# Patient Record
Sex: Male | Born: 1940 | Race: White | Hispanic: No | Marital: Married | Smoking: Former smoker
Health system: Southern US, Community
[De-identification: ages and names within clinical notes are randomized; demographics above are authoritative.]

## PROBLEM LIST (undated history)

## (undated) DIAGNOSIS — I10 Essential (primary) hypertension: Secondary | ICD-10-CM

## (undated) DIAGNOSIS — J439 Emphysema, unspecified: Secondary | ICD-10-CM

## (undated) DIAGNOSIS — E785 Hyperlipidemia, unspecified: Secondary | ICD-10-CM

## (undated) HISTORY — PX: KYPHOSIS SURGERY: SHX114

## (undated) HISTORY — DX: Hyperlipidemia, unspecified: E78.5

## (undated) HISTORY — PX: TONSILLECTOMY: SUR1361

## (undated) HISTORY — PX: FIXATION KYPHOPLASTY LUMBAR SPINE: SHX1642

## (undated) HISTORY — PX: CORONARY ARTERY BYPASS GRAFT: SHX141

## (undated) HISTORY — DX: Essential (primary) hypertension: I10

## (undated) HISTORY — PX: SHOULDER ARTHROSCOPY: SHX128

## (undated) HISTORY — DX: Emphysema, unspecified: J43.9

---

## 1999-08-13 ENCOUNTER — Encounter: Admission: RE | Admit: 1999-08-13 | Discharge: 1999-08-13 | Payer: Self-pay | Admitting: Family Medicine

## 1999-08-13 ENCOUNTER — Encounter: Payer: Self-pay | Admitting: Family Medicine

## 1999-10-29 ENCOUNTER — Encounter: Admission: RE | Admit: 1999-10-29 | Discharge: 1999-10-29 | Payer: Self-pay | Admitting: Family Medicine

## 1999-10-29 ENCOUNTER — Encounter: Payer: Self-pay | Admitting: Family Medicine

## 2000-07-29 ENCOUNTER — Encounter: Payer: Self-pay | Admitting: Family Medicine

## 2000-07-29 ENCOUNTER — Encounter: Admission: RE | Admit: 2000-07-29 | Discharge: 2000-07-29 | Payer: Self-pay | Admitting: Family Medicine

## 2003-09-08 ENCOUNTER — Encounter: Admission: RE | Admit: 2003-09-08 | Discharge: 2003-09-08 | Payer: Self-pay | Admitting: Orthopedic Surgery

## 2008-03-03 ENCOUNTER — Encounter: Admission: RE | Admit: 2008-03-03 | Discharge: 2008-03-03 | Payer: Self-pay | Admitting: Orthopedic Surgery

## 2008-03-04 ENCOUNTER — Encounter: Admission: RE | Admit: 2008-03-04 | Discharge: 2008-03-04 | Payer: Self-pay | Admitting: Orthopedic Surgery

## 2008-03-24 ENCOUNTER — Encounter: Admission: RE | Admit: 2008-03-24 | Discharge: 2008-03-24 | Payer: Self-pay | Admitting: Orthopedic Surgery

## 2009-04-27 ENCOUNTER — Ambulatory Visit: Payer: Self-pay | Admitting: Internal Medicine

## 2009-04-27 DIAGNOSIS — J441 Chronic obstructive pulmonary disease with (acute) exacerbation: Secondary | ICD-10-CM

## 2009-05-04 ENCOUNTER — Ambulatory Visit: Payer: Self-pay | Admitting: Internal Medicine

## 2009-05-04 DIAGNOSIS — J9611 Chronic respiratory failure with hypoxia: Secondary | ICD-10-CM | POA: Insufficient documentation

## 2009-05-04 DIAGNOSIS — J45909 Unspecified asthma, uncomplicated: Secondary | ICD-10-CM | POA: Insufficient documentation

## 2009-05-04 DIAGNOSIS — J438 Other emphysema: Secondary | ICD-10-CM

## 2009-05-04 DIAGNOSIS — E785 Hyperlipidemia, unspecified: Secondary | ICD-10-CM | POA: Insufficient documentation

## 2009-05-04 DIAGNOSIS — I10 Essential (primary) hypertension: Secondary | ICD-10-CM | POA: Insufficient documentation

## 2009-06-01 ENCOUNTER — Ambulatory Visit: Payer: Self-pay | Admitting: Internal Medicine

## 2009-07-27 ENCOUNTER — Telehealth: Payer: Self-pay | Admitting: Internal Medicine

## 2009-09-11 ENCOUNTER — Ambulatory Visit: Payer: Self-pay | Admitting: Internal Medicine

## 2010-03-13 ENCOUNTER — Telehealth: Payer: Self-pay | Admitting: Internal Medicine

## 2010-08-06 ENCOUNTER — Encounter: Payer: Self-pay | Admitting: Orthopedic Surgery

## 2010-08-14 NOTE — Progress Notes (Signed)
Summary: nos appt  Phone Note Call from Patient   Caller: [email or web address]  Call For: Madilyne Tadlock Summary of Call: In ref to nos from 8/29, pt states he will call to rsc. Initial call taken by: Darletta Moll,  March 13, 2010 10:55 AM

## 2010-08-14 NOTE — Progress Notes (Signed)
Summary: rx-LMTCB x2 OK resume Lisinopril  Phone Note Call from Patient   Caller: Patient Call For: Khiya Friese Summary of Call: Benicar doesn't work as well as Lisinopril.  Would like to go back to Lisinopril. [phone] - [facility] Initial call taken by: Eugene Gavia,  July 27, 2009 12:03 PM  Follow-up for Phone Call        LMOM TCB. Boone Master CNA  July 27, 2009 12:24 PM   LMTCB x 2 .Carron Curie CMA  July 28, 2009 3:43 PM   Additional Follow-up for Phone Call Additional follow up Details #1::        Per last ov note on 06/01/09, pt was to trial samples of Benicar HCT 20/12.5 x15mo in place of lisinopril to see if cough improved.  Spoke with pt.  Pt states cough and breathing are the same-no better or worse.  Would like to know if he can resume Lisinopril?  WIll forward message to CY-please advise.  Thanks! Additional Follow-up by: Gweneth Dimitri RN,  July 28, 2009 3:51 PM    Additional Follow-up for Phone Call Additional follow up Details #2::    OK to resume Lisinopril Follow-up by: Waymon Budge MD,  July 28, 2009 5:17 PM  Additional Follow-up for Phone Call Additional follow up Details #3:: Details for Additional Follow-up Action Taken: pt advised. rx sent. Carron Curie CMA  July 28, 2009 5:19 PM   Prescriptions: LISINOPRIL-HYDROCHLOROTHIAZIDE 20-25 MG TABS (LISINOPRIL-HYDROCHLOROTHIAZIDE) take 1 by mouth once daily  #30 x 3   Entered by:   Carron Curie CMA   Authorized by:   Waymon Budge MD   Signed by:   Carron Curie CMA on 07/28/2009   Method used:   Electronically to        [facility]  [id number] (retail)       [address]       [place], [place]  [address]       Ph: [phone]       Fax: [phone]   RxID:   [id number]

## 2010-08-14 NOTE — Assessment & Plan Note (Signed)
Summary: 81M RECK/KLW   Primary Provider/Referring Provider:  Runell Gess  CC:  3 month followup.  Pt states that cough is the same- no better or worse.  No new complaints today.[name]  History of Present Illness: April 27, 2009- 68 [name] referred by Dr Janece Canterbury in pulmonary evaluation because of easy exertional dyspnea. He had been a heavy smoker. No childhood problems. Short of breath walking to mailbox.Daily cough productive of white mucus. Wife emphasizes they are moving away from a moldy home, but specific plans aren't yet made. He feels comfortable resting and supine. Somewhat worse in pollen season.Some wheeze. He has been told in the past he had COPD. Recently Spiriva has helped more than his rescue inhaler. Seasonal rhinitis in past years. No pneumovax. avoids flu vax "makes him sick".  June 01, 2009- COPD, Asthma........................................Marland Kitchenwife here They ask again if mold is bad, saying they are going to walk away from their home because of mold. Discussed dehumidifiers. Tends to cough, not changed. Scant clear.  Got Spiriva- helps. PFT- moderate COPD- emphysma, with little response to bronchodilator - Sat of 88% at start, improving to 92 with exertion. Note that at rest today here on room air he is at 92%. CXR- COPD, NAD  2009-09-16- COPD, Asthma Since last here may have had a minor cold, but no Flu. Did have flu vax. Coughs some with scant clear mucus. Sinus drip he treats with otc antihistamine every other day. Denies wheezing.  Needs refill on Spiriva. Dyspnea is unchanged- brisk walk across street or climbing stairs.    Preventive Screening-Counseling & Management  Alcohol-Tobacco     Smoking Status: quit  Current Medications (verified): 1)  Etodolac 400 Mg Tabs (Etodolac) .... Take 2 By Mouth Once Daily 2)  Metoprolol Succinate 25 Mg Xr24h-Tab (Metoprolol Succinate) .... Take 1 By Mouth Once Daily 3)  Simvastatin 80 Mg Tabs  (Simvastatin) .... Take 1 By Mouth Once Daily 4)  Lisinopril-Hydrochlorothiazide 20-25 Mg Tabs (Lisinopril-Hydrochlorothiazide) .... Take 1 By Mouth Once Daily 5)  Isosorbide Dinitrate 30 Mg Tabs (Isosorbide Dinitrate) .... Take 1 By Mouth Once Daily 6)  Cardura 4 Mg Tabs (Doxazosin Mesylate) .... Take 1 By Mouth Once Daily 7)  Aspirin 81 Mg Tbec (Aspirin) .... Take 1 By Mouth Once Daily 8)  Spiriva Handihaler 18 Mcg Caps (Tiotropium Bromide Monohydrate) .[name].. 1 Daily  Allergies (verified): 1)  ! Hydrocodone  Past History:  Past Medical History: Last updated: 04/27/2009 Asthma Emphysema Hyperlipidemia Hypertension  Past Surgical History: Last updated: 04/27/2009 Rt shoulder arthroscopy Tonsillectomy Kyphoplasty- right shoulder Kyphoplasty -  lumbar kyphoplasty CABG  Family History: Last updated: 16-Sep-2009 Father- died MI Mother- died breast cancer Sister- died breast cancer  Social History: Last updated: 09/16/2009 Patient states former smoker. Quit age 89 Married Retired- worked as Production designer, theatre/television/film and superintendant at hotels  Risk Factors: Smoking Status: quit (09-16-09)  Family History: Father- died MI Mother- died breast cancer Sister- died breast cancer  Social History: Patient states former smoker. Quit age 12 Married Retired- worked as Production designer, theatre/television/film and superintendant at hotels  Smoking Status:  quit  Review of Systems      See HPI       The patient complains of dyspnea on exertion and prolonged cough.  The patient denies anorexia, fever, weight loss, weight gain, vision loss, decreased hearing, hoarseness, chest pain, syncope, peripheral edema, headaches, hemoptysis, and severe indigestion/heartburn.    Vital Signs:  Patient profile:   70 year old male Weight:  230.50 pounds O2 Sat:      95 % on Room air Pulse rate:   73 / minute BP sitting:   122 / 68  (left arm)  Vitals Entered By: Vernie Murders (September 11, 2009 2:12 PM)  O2 Flow:   Room air  Physical Exam  Additional Exam:  General: A/Ox3; pleasant and cooperative, NAD, tall,  SKIN: no rash, lesions NODES: no lymphadenopathy HEENT: [place]/AT, EOM- WNL, Conjuctivae- clear, PERRLA, TM-WNL, Nose- clear, Throat- clear and wnl, no stridor NECK: Supple w/ fair ROM, JVD- none, normal carotid impulses w/o bruits Thyroid-  CHEST: Clear to P&A, occasional dry cough HEART: RRR, no m/g/r heard ABDOMEN: Soft and nl;  EGB:TDVV, nl pulses, no edema  NEURO: Grossly intact to observation      Impression & Recommendations:  Problem # 1:  COPD (ICD-496) Clinically stable. Emphasized endurance exercise and reviewed meds. PFT last Fall confirmed moderate emphysema/ COPD with little response to dilator. CXR had shown COPD and post CABG changes.  Other Orders: Est. Patient Level III (61607) Prescription Created Electronically [id number]) Prescription Created Electronically [id number])  Patient Instructions: 1)  Please schedule a follow-up appointment in 6 months. 2)  Script for Spiriva sent to your drug store. Prescriptions: LISINOPRIL-HYDROCHLOROTHIAZIDE 20-25 MG TABS (LISINOPRIL-HYDROCHLOROTHIAZIDE) take 1 by mouth once daily  #30 x prn   Entered and Authorized by:   Waymon Budge MD   Signed by:   Waymon Budge MD on 09/11/2009   Method used:   Electronically to        [facility]  [address] [phone]* (retail)       [address]       [place], [place]  [address]       Ph: [phone]       Fax: [phone]   RxID:   [id number]

## 2010-12-22 IMAGING — CR DG CHEST 2V
2 series · 2 of 2 positions shown · non-contrast
Comparison: Report of a prior study 07/29/2000.  The actual images
have been purged and are not available.

CLINICAL DATA: Dyspnea/asthma

CHEST - 2 VIEW

[view not recorded (1 of 2)]
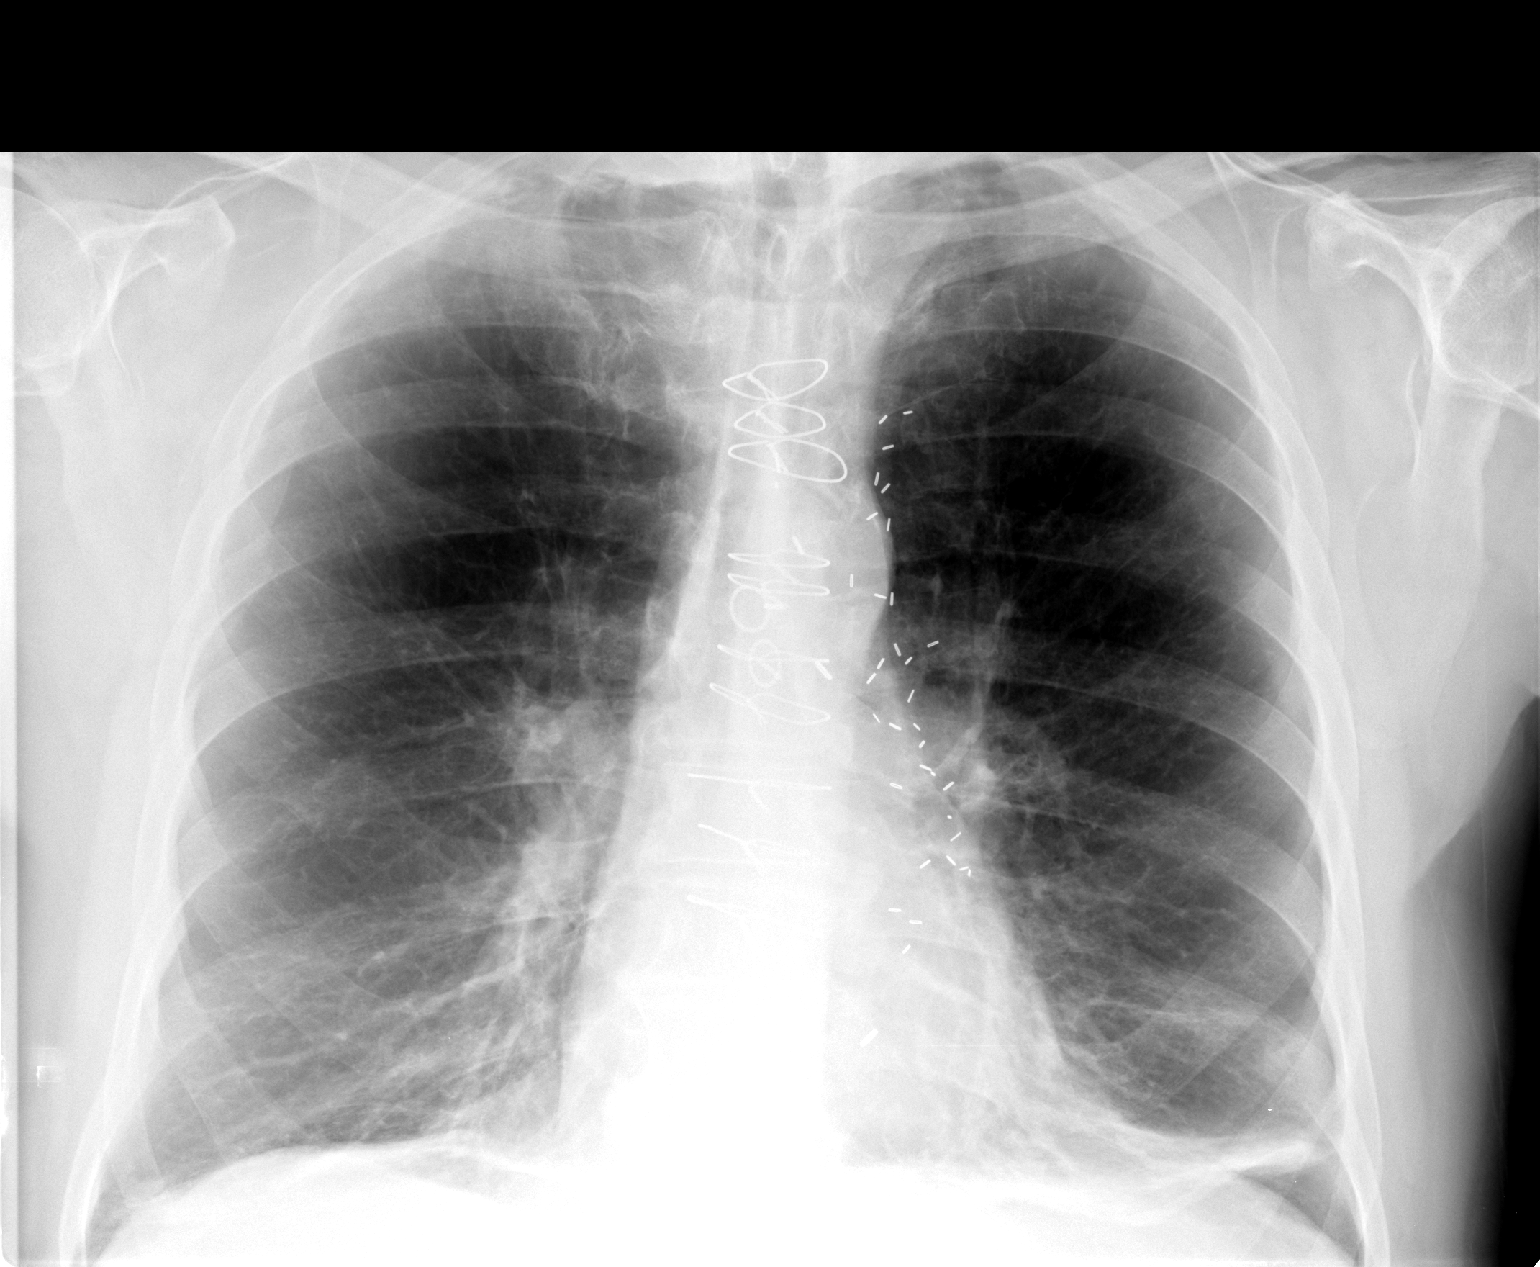

[view not recorded (2 of 2)]
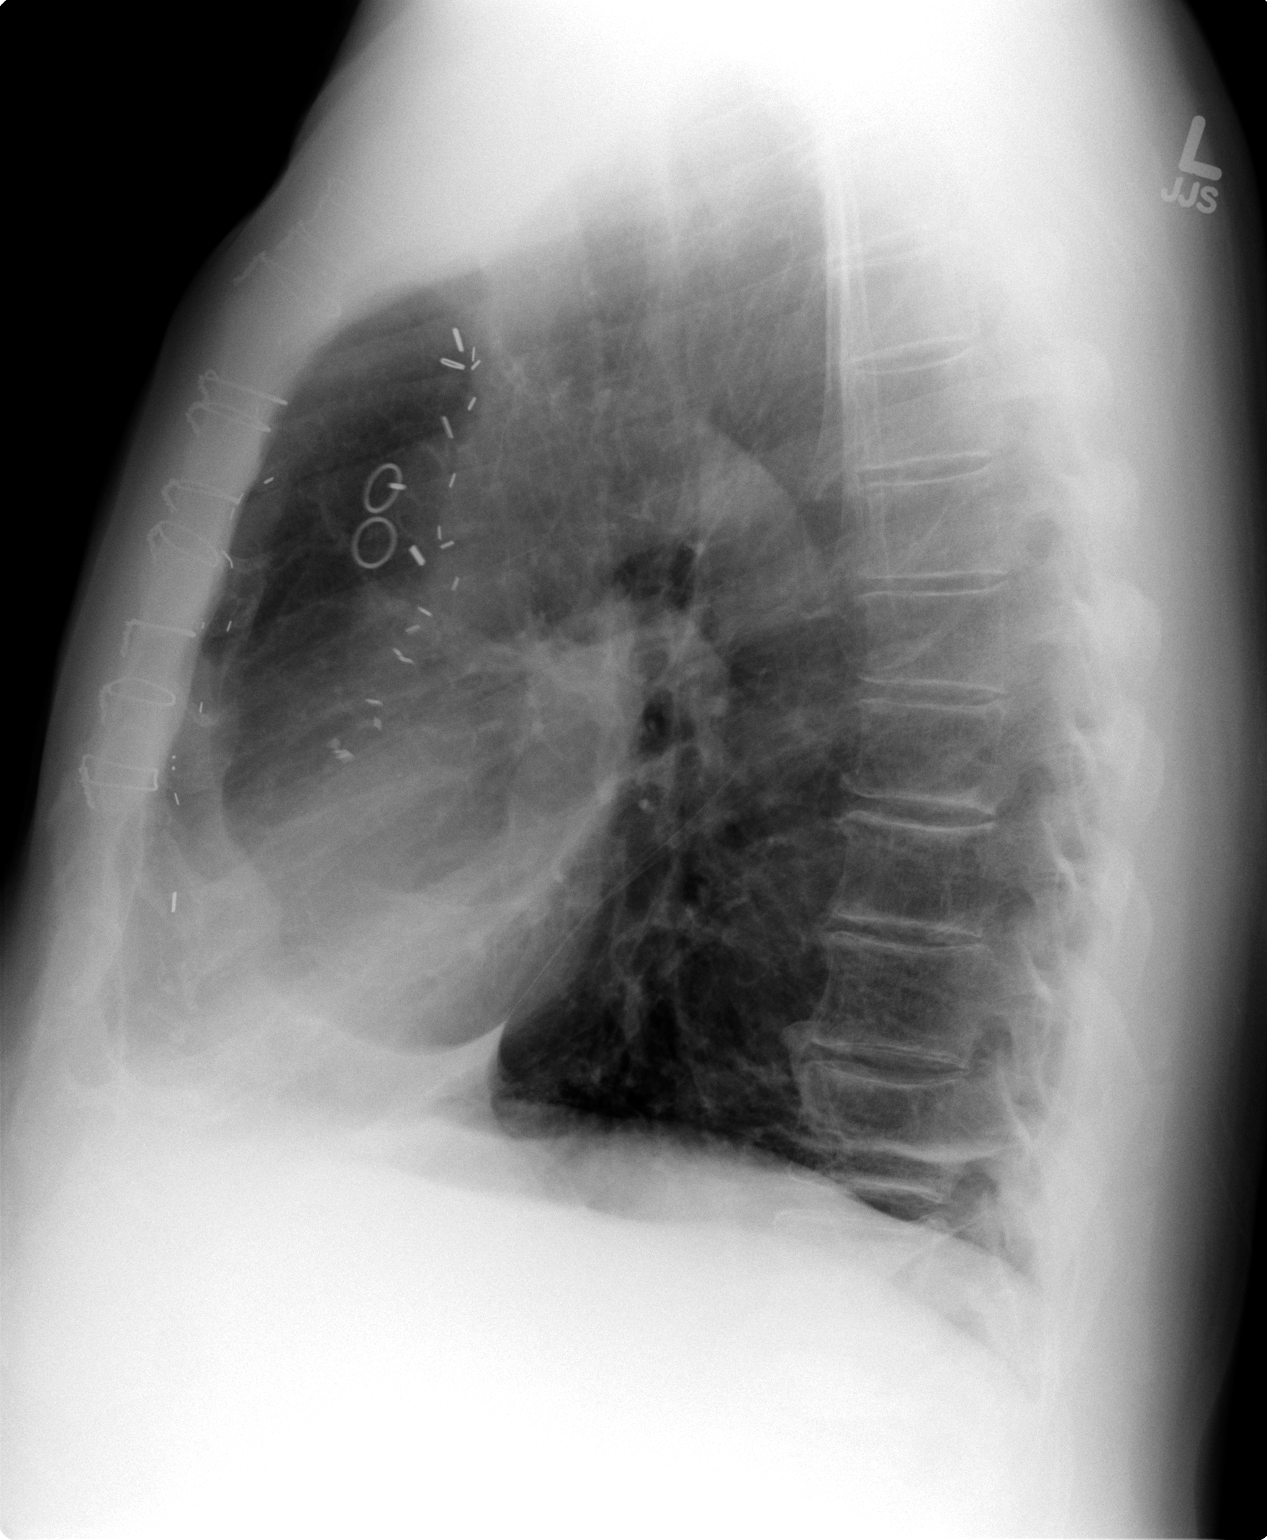

[2 of 2 positions shown; findings below may reference images not displayed]

FINDINGS: Post CABG.  Heart and vascularity normal.  There are
changes of moderately advanced COPD.  There is linear atelectasis
or scarring at the left base.  This was described in the report of
the prior study. Are there any outside chest x-rays that we might
be able to obtain for comparison.

No pleural fluid or osseous lesions.
IMPRESSION: 1.  Post CABG - no heart failure.
2.  COPD with probable linear atelectasis or scarring at the left
base.  This was described in the report of a prior study dated
07/29/2000.  Probably no active disease.  See above discussion.

## 2011-09-05 ENCOUNTER — Encounter: Payer: Self-pay | Admitting: Internal Medicine

## 2011-09-06 ENCOUNTER — Ambulatory Visit (INDEPENDENT_AMBULATORY_CARE_PROVIDER_SITE_OTHER): Payer: Medicare Other | Admitting: Internal Medicine

## 2011-09-06 ENCOUNTER — Ambulatory Visit (INDEPENDENT_AMBULATORY_CARE_PROVIDER_SITE_OTHER)
Admission: RE | Admit: 2011-09-06 | Discharge: 2011-09-06 | Disposition: A | Discharge status: Home / Self Care | Payer: Medicare Other | Source: Ambulatory Visit | Attending: Internal Medicine | Admitting: Internal Medicine

## 2011-09-06 ENCOUNTER — Encounter: Payer: Self-pay | Admitting: Internal Medicine

## 2011-09-06 VITALS — BP 170/70 | HR 95 | Ht 72.0 in | Wt 250.6 lb

## 2011-09-06 DIAGNOSIS — J449 Chronic obstructive pulmonary disease, unspecified: Secondary | ICD-10-CM

## 2011-09-06 MED ORDER — TIOTROPIUM BROMIDE MONOHYDRATE 18 MCG IN CAPS: 18.0000 ug | ORAL_CAPSULE | Freq: Every day | RESPIRATORY_TRACT | Status: DC

## 2011-09-06 NOTE — Progress Notes (Signed)
09/06/11- 63 [name] former smoker followed for asthma, COPD LOV- 09/11/09    Wife here He describes persistent cough, improved with Spiriva but productive of yellow sputum. They got rid of their dog and move to a new home to get away from mold and mildew. Overall he had done much better until this episode. Taking occasional loratadine. Comes today to refill Spiriva. We discussed evaluation of his status. His wife is concerned and asks that we update his PFT. He denies fever, blood, chest pain or palpitation, swelling or nodes. PFT: 05/04/2009-moderate obstructive airways disease with insignificant response to bronchodilator, air trapping, diffusion mildly reduced. FEV1 2.26/73%, FEV1/FVC 0.50, DLCO 71%.  ROS-see HPI Constitutional:   No-   weight loss, night sweats, fevers, chills, fatigue, lassitude. HEENT:   No-  headaches, difficulty swallowing, tooth/dental problems, sore throat,       No-  sneezing, itching, ear ache, nasal congestion, post nasal drip,  CV:  No-   chest pain, orthopnea, PND, swelling in lower extremities, anasarca, dizziness, palpitations Resp: +  shortness of breath with exertion or at rest.             + productive cough,  No non-productive cough,  No- coughing up of blood.              No-   change in color of mucus.  No- wheezing.   Skin: No-   rash or lesions. GI:  No-   heartburn, indigestion, abdominal pain, nausea, vomiting, diarrhea,                 change in bowel habits, loss of appetite GU: . MS:  No-   joint pain or swelling.  No- decreased range of motion.  No- back pain. Neuro-     nothing unusual Psych:  No- change in mood or affect. No depression or anxiety.  No memory loss.  OBJ- Physical Exam General- Alert, Oriented, Affect-appropriate, Distress- none acute, overweight Skin- rash-none, lesions- none, excoriation- none Lymphadenopathy- none Head- atraumatic            Eyes- Gross vision intact, PERRLA, conjunctivae and secretions clear            Ears-  Hearing, canals-normal            Nose- Clear, no-Septal dev, mucus, polyps, erosion, perforation             Throat- Mallampati III , mucosa clear , drainage- none, tonsils- atrophic Neck- flexible , trachea midline, no stridor , thyroid nl, carotid no bruit Chest - symmetrical excursion , unlabored           Heart/CV- RRR , no murmur , no gallop  , no rub, nl s1 s2                           - JVD- none , edema- none, stasis changes- none, varices- none           Lung- clear to P&A, diminished, unlabored, wheeze- none, cough- none , dullness-none, rub- none           Chest wall-  Abd- Br/ Gen/ Rectal- Not done, not indicated Extrem- cyanosis- none, clubbing, none, atrophy- none, strength- nl Neuro- grossly intact to observation

## 2011-09-06 NOTE — Patient Instructions (Signed)
Script to refill Spiriva  Sample Dulera 100   2 puffs then rinse mouth, twice every day  Order- CXR  Dx COPD  Order- schedule PFT  Dx COPD

## 2011-09-09 NOTE — Assessment & Plan Note (Signed)
His immediate request was for refill Spiriva. We discussed alternatives. Plan-refill Spiriva. Sample Dulera. Schedule repeat PFT. Chest x-ray.

## 2011-09-12 NOTE — Progress Notes (Signed)
Quick Note:  Pt aware of results. ______ 

## 2011-09-13 ENCOUNTER — Ambulatory Visit (INDEPENDENT_AMBULATORY_CARE_PROVIDER_SITE_OTHER): Payer: Medicare Other | Admitting: Internal Medicine

## 2011-09-13 DIAGNOSIS — J449 Chronic obstructive pulmonary disease, unspecified: Secondary | ICD-10-CM

## 2011-09-13 LAB — PULMONARY FUNCTION TEST

## 2011-09-13 NOTE — Progress Notes (Signed)
PFT done today. 

## 2011-12-10 ENCOUNTER — Ambulatory Visit: Payer: Medicare Other | Admitting: Internal Medicine

## 2012-01-07 ENCOUNTER — Telehealth: Payer: Self-pay | Admitting: Internal Medicine

## 2012-01-07 NOTE — Telephone Encounter (Signed)
lmomtcb x1 for janice 

## 2012-01-07 NOTE — Telephone Encounter (Signed)
Spoke with pt and notified that pt needs HFU with CDY next wk. Pt d/c'ed from [facility] a wk ago. Please advise thanks!

## 2012-01-08 NOTE — Telephone Encounter (Signed)
Per CY: can see TP for post hosp this week or early next week and follow up with CY in Aug.  LMOM TCB x1.

## 2012-01-09 NOTE — Telephone Encounter (Signed)
Pt has been scheduled for HFU with TP on 01/14/12 @ 4pm per CDY. They will schedule follow-up with CDY when here for that appt.

## 2012-01-14 ENCOUNTER — Ambulatory Visit (INDEPENDENT_AMBULATORY_CARE_PROVIDER_SITE_OTHER)
Admission: RE | Admit: 2012-01-14 | Discharge: 2012-01-14 | Disposition: A | Discharge status: Home / Self Care | Payer: Medicare Other | Source: Ambulatory Visit | Attending: Adult Health | Admitting: Adult Health

## 2012-01-14 ENCOUNTER — Ambulatory Visit (INDEPENDENT_AMBULATORY_CARE_PROVIDER_SITE_OTHER): Payer: Medicare Other | Admitting: Adult Health

## 2012-01-14 ENCOUNTER — Encounter: Payer: Self-pay | Admitting: Adult Health

## 2012-01-14 VITALS — HR 96 | Temp 97.2°F | Ht 72.5 in | Wt 247.4 lb

## 2012-01-14 DIAGNOSIS — J449 Chronic obstructive pulmonary disease, unspecified: Secondary | ICD-10-CM

## 2012-01-14 NOTE — Progress Notes (Signed)
81 [name] former smoker followed for asthma, COPD  LOV- 09/11/09    Wife here He describes persistent cough, improved with Spiriva but productive of yellow sputum. They got rid of their dog and move to a new home to get away from mold and mildew. Overall he had done much better until this episode. Taking occasional loratadine. Comes today to refill Spiriva. We discussed evaluation of his status. His wife is concerned and asks that we update his PFT. He denies fever, blood, chest pain or palpitation, swelling or nodes. PFT: 05/04/2009-moderate obstructive airways disease with insignificant response to bronchodilator, air trapping, diffusion mildly reduced. FEV1 2.26/73%, FEV1/FVC 0.50, DLCO 71%.  01/14/2012 Post Hospital  Discharged 2 weeks ago for PNA at [facility] . No records available.  Finished all the abx and steroid taper. Feeling better, but still has cough. Mainly dry.  Of note on ACE inhibitor.  No fever or chest pain .    ROS- Constitutional:   No  weight loss, night sweats,  Fevers, chills ++, fatigue, or  lassitude.  HEENT:   No headaches,  Difficulty swallowing,  Tooth/dental problems, or  Sore throat,                No sneezing, itching, ear ache,  ++nasal congestion, post nasal drip,   CV:  No chest pain,  Orthopnea, PND, swelling in lower extremities, anasarca, dizziness, palpitations, syncope.   GI  No heartburn, indigestion, abdominal pain, nausea, vomiting, diarrhea, change in bowel habits, loss of appetite, bloody stools.   Resp:    No coughing up of blood.     No chest wall deformity  Skin: no rash or lesions.  GU: no dysuria, change in color of urine, no urgency or frequency.  No flank pain, no hematuria   MS:  No joint pain or swelling.  No decreased range of motion.  No back pain.  Psych:  No change in mood or affect. No depression or anxiety.  No memory loss.   OBJ- Physical Exam GEN: A/Ox3; pleasant , NAD,   HEENT:  [place]/AT,  EACs-clear, TMs-wnl, NOSE-clear,  THROAT-clear, no lesions, no postnasal drip or exudate noted.   NECK:  Supple w/ fair ROM; no JVD; normal carotid impulses w/o bruits; no thyromegaly or nodules palpated; no lymphadenopathy.  RESP  Coarse BS .no accessory muscle use, no dullness to percussion  CARD:  RRR, no m/r/g  , tr  peripheral edema, pulses intact, no cyanosis or clubbing.  GI:   Soft & nt; nml bowel sounds; no organomegaly or masses detected.  Musco: Warm bil, no deformities or joint swelling noted.   Neuro: alert, no focal deficits noted.    Skin: Warm, no lesions or rashes

## 2012-01-14 NOTE — Patient Instructions (Addendum)
I will call with xray results.  Continue on current regimen .  follow up Dr. Maple Hudson  In 6 weeks and As needed   Discuss with family doctor regarding your blood pressure pill -Lisinopril may be causing a persistent/recurrent cough .

## 2012-01-15 NOTE — Assessment & Plan Note (Signed)
REcent exacerbation w/ ? CAP  No records available from [facility]  Will repeat cxr today  ? ACE contributing to his persistent cough   Plan:  I will call with xray results.  Continue on current regimen .  follow up Dr. Maple Hudson  In 6 weeks and As needed   Discuss with family doctor regarding your blood pressure pill -Lisinopril may be causing a persistent/recurrent cough .

## 2012-01-20 ENCOUNTER — Telehealth: Payer: Self-pay | Admitting: Internal Medicine

## 2012-01-20 NOTE — Telephone Encounter (Signed)
Notes Recorded by Julio Sicks, NP on 01/15/2012 at 8:58 AM Xray is ok  PNA is resolved  Cont w/ ov recs   lmomtcb x1

## 2012-01-21 NOTE — Telephone Encounter (Signed)
I spoke with spouse and is aware of results. She voiced her understanding and needed nothing further

## 2012-02-25 ENCOUNTER — Encounter: Payer: Self-pay | Admitting: Internal Medicine

## 2012-02-25 ENCOUNTER — Ambulatory Visit (INDEPENDENT_AMBULATORY_CARE_PROVIDER_SITE_OTHER): Payer: Medicare Other | Admitting: Internal Medicine

## 2012-02-25 VITALS — BP 150/86 | HR 105 | Ht 72.0 in | Wt 246.6 lb

## 2012-02-25 DIAGNOSIS — J449 Chronic obstructive pulmonary disease, unspecified: Secondary | ICD-10-CM

## 2012-02-25 NOTE — Progress Notes (Signed)
71 [name] former smoker followed for asthma, COPD  LOV- 09/11/09    Wife here He describes persistent cough, improved with Spiriva but productive of yellow sputum. They got rid of their dog and moved to a new home to get away from mold and mildew. Overall he had done much better until this episode. Taking occasional loratadine. Comes today to refill Spiriva. We discussed evaluation of his status. His wife is concerned and asks that we update his PFT. He denies fever, blood, chest pain or palpitation, swelling or nodes. PFT: 05/04/2009-moderate obstructive airways disease with insignificant response to bronchodilator, air trapping, diffusion mildly reduced. FEV1 2.26/73%, FEV1/FVC 0.50, DLCO 71%.  01/14/2012 Post Hospital  Discharged 2 weeks ago for PNA at [facility] . No records available.  Finished all the abx and steroid taper. Feeling better, but still has cough. Mainly dry.  Of note on ACE inhibitor.  No fever or chest pain .   02/25/12- [name] former smoker followed for asthma, COPD States about the same; slight wheezing and cough, denies any SOB, chest tightness, or chest pain. Hospitalized for pneumonia at [facility]. Occasional productive cough, never blood. Uses Alka-Seltzer Plus occasionally for chest congestion. Much dry cough-we discussed lisinopril. Spiriva helps. COPD assessment test (CAT) 24/40  ROS-see HPI Constitutional:   No-   weight loss, night sweats, fevers, chills, fatigue, lassitude. HEENT:   No-  headaches, difficulty swallowing, tooth/dental problems, sore throat,       No-  sneezing, itching, ear ache, +nasal congestion, post nasal drip,  CV:  No-   chest pain, orthopnea, PND, swelling in lower extremities, anasarca, dizziness, palpitations Resp: + Some  shortness of breath with exertion or at rest.              No-   productive cough,  + non-productive cough,  No- coughing up of blood.              No-   change in color of mucus.  No- wheezing.   Skin: No-   rash or  lesions. GI:  No-   heartburn, indigestion, abdominal pain, nausea, vomiting,  GU:  MS:  No-   joint pain or swelling.   Neuro-     nothing unusual Psych:  No- change in mood or affect. No depression or anxiety.  No memory loss.  OBJ- Physical Exam General- Alert, Oriented, Affect-appropriate, Distress- none acute Skin- rash-none, lesions- none, excoriation- none Lymphadenopathy- none Head- atraumatic            Eyes- Gross vision intact, PERRLA, conjunctivae and secretions clear            Ears- Hearing, canals-normal            Nose- Clear, no-Septal dev, mucus, polyps, erosion, perforation             Throat- Mallampati II , mucosa clear , drainage- none, tonsils- atrophic Neck- flexible , trachea midline, no stridor , thyroid nl, carotid no bruit Chest - symmetrical excursion , unlabored           Heart/CV- RRR , no murmur , no gallop  , no rub, nl s1 s2                           - JVD- none , edema- none, stasis changes- none, varices- none           Lung- + few rhonchi right upper lobe, wheeze- none, cough- none ,  dullness-none, rub- none           Chest wall-  Abd-  Br/ Gen/ Rectal- Not done, not indicated Extrem- cyanosis- none, clubbing, none, atrophy- none, strength- nl Neuro- +pill-rolling tremor

## 2012-02-25 NOTE — Patient Instructions (Addendum)
Call for Spiriva refill prescription when needed  Ok to use occasional Alkaseltzer Plus if needed. Remember that it has aspirin in it.  You might want to try otc Mucinex for chest congestion. It can help loosen mucus so you can cough it out easier.

## 2012-03-03 NOTE — Assessment & Plan Note (Signed)
Much better control. He finds that occasional Alka-Seltzer helps at times. It may just numb the sensation, but he seems satisfied for now. We have discussed the importance of lisinopril/ACE inhibitors and dry cough

## 2012-04-20 ENCOUNTER — Telehealth: Payer: Self-pay | Admitting: Internal Medicine

## 2012-04-20 MED ORDER — TIOTROPIUM BROMIDE MONOHYDRATE 18 MCG IN CAPS: 18.0000 ug | ORAL_CAPSULE | Freq: Every day | RESPIRATORY_TRACT | Status: DC

## 2012-04-20 NOTE — Telephone Encounter (Signed)
Please advise if okay to give samples. thanks

## 2012-04-20 NOTE — Telephone Encounter (Signed)
Spoke with patients wife; aware that 1 sample of Spiriva at front and Pt Assistance program information in bag as well. Pt's wife is aware to fill out and bring needed information back and I will send to company.

## 2012-08-27 ENCOUNTER — Ambulatory Visit: Payer: Medicare Other | Admitting: Internal Medicine

## 2012-10-01 ENCOUNTER — Encounter: Payer: Self-pay | Admitting: Internal Medicine

## 2012-10-01 ENCOUNTER — Ambulatory Visit (INDEPENDENT_AMBULATORY_CARE_PROVIDER_SITE_OTHER): Payer: Medicare Other | Admitting: Internal Medicine

## 2012-10-01 VITALS — BP 160/100 | HR 93 | Ht 72.0 in | Wt 244.8 lb

## 2012-10-01 DIAGNOSIS — J449 Chronic obstructive pulmonary disease, unspecified: Secondary | ICD-10-CM

## 2012-10-01 DIAGNOSIS — J4489 Other specified chronic obstructive pulmonary disease: Secondary | ICD-10-CM

## 2012-10-01 NOTE — Progress Notes (Signed)
82 [name] former smoker followed for asthma, COPD  LOV- 09/11/09    Wife here He describes persistent cough, improved with Spiriva but productive of yellow sputum. They got rid of their dog and moved to a new home to get away from mold and mildew. Overall he had done much better until this episode. Taking occasional loratadine. Comes today to refill Spiriva. We discussed evaluation of his status. His wife is concerned and asks that we update his PFT. He denies fever, blood, chest pain or palpitation, swelling or nodes. PFT: 05/04/2009-moderate obstructive airways disease with insignificant response to bronchodilator, air trapping, diffusion mildly reduced. FEV1 2.26/73%, FEV1/FVC 0.50, DLCO 71%.  01/14/2012 Post Hospital  Discharged 2 weeks ago for PNA at [facility] . No records available.  Finished all the abx and steroid taper. Feeling better, but still has cough. Mainly dry.  Of note on ACE inhibitor.  No fever or chest pain .   02/25/12- [name] former smoker followed for asthma, COPD States about the same; slight wheezing and cough, denies any SOB, chest tightness, or chest pain. Hospitalized for pneumonia at [facility] Occasional productive cough, never blood. Uses Alka-Seltzer Plus occasionally for chest congestion. Much dry cough-we discussed lisinopril. Spiriva helps. COPD assessment test (CAT) 24/40  10/01/12- [name] former smoker followed for asthma, COPD FOLLOWS FOR: has not had Sprivia in about 3 days due to money; can tell his breathing has worsened since. Increased SOB. He got through the winter okay. Now out of Spiriva which has helped. Price is a problem. We discussed alternate day Spiriva or switch to Atrovent inhaler. sheCXR 01/21/12 IMPRESSION:  COPD. Stable bibasilar linear scarring. No definite active  process.  Original Report Authenticated By: Juline Patch, M.D.   ROS-see HPI Constitutional:   No-   weight loss, night sweats, fevers, chills, fatigue, lassitude. HEENT:   No-   headaches, difficulty swallowing, tooth/dental problems, sore throat,       No-  sneezing, itching, ear ache, +nasal congestion, post nasal drip,  CV:  No-   chest pain, orthopnea, PND, swelling in lower extremities, anasarca, dizziness, palpitations Resp: + Some  shortness of breath with exertion or at rest.              No-   productive cough,  + non-productive cough,  No- coughing up of blood.              No-   change in color of mucus.  No- wheezing.   Skin: No-   rash or lesions. GI:  No-   heartburn, indigestion, abdominal pain, nausea, vomiting,  GU:  MS:  No-   joint pain or swelling.   Neuro-     nothing unusual Psych:  No- change in mood or affect. No depression or anxiety.  No memory loss.  OBJ- Physical Exam General- Alert, Oriented, Affect-appropriate, Distress- none acute Skin- rash-none, lesions- none, excoriation- none Lymphadenopathy- none Head- atraumatic            Eyes- Gross vision intact, PERRLA, conjunctivae and secretions clear            Ears- Hearing, canals-normal            Nose- Clear, no-Septal dev, mucus, polyps, erosion, perforation             Throat- Mallampati II , mucosa clear , drainage- none, tonsils- atrophic Neck- flexible , trachea midline, no stridor , thyroid nl, carotid no bruit Chest - symmetrical excursion , unlabored  Heart/CV- RRR , no murmur , no gallop  , no rub, nl s1 s2                           - JVD- none , edema- none, stasis changes- none, varices- none           Lung- clear, unlabored, wheeze- none, cough- none , dullness-none, rub- none           Chest wall-  Abd-  Br/ Gen/ Rectal- Not done, not indicated Extrem- cyanosis- none, clubbing, none, atrophy- none, strength- nl Neuro- +pill-rolling tremor

## 2012-10-01 NOTE — Patient Instructions (Addendum)
To make Spiriva last, you can try taking it every other day when your supply is low, to make it last.   Walk as much as you can to build stamina.  Please call as needed

## 2012-10-08 ENCOUNTER — Encounter: Payer: Self-pay | Admitting: Internal Medicine

## 2012-10-08 NOTE — Assessment & Plan Note (Signed)
Controlled to prevent exacerbations. Plan-because of cost, try reducing Spiriva to alternate day. Medications were discussed.

## 2013-04-05 ENCOUNTER — Ambulatory Visit (INDEPENDENT_AMBULATORY_CARE_PROVIDER_SITE_OTHER): Payer: Medicare Other | Admitting: Internal Medicine

## 2013-04-05 ENCOUNTER — Encounter: Payer: Self-pay | Admitting: Internal Medicine

## 2013-04-05 VITALS — BP 142/80 | HR 95 | Ht 72.0 in | Wt 249.8 lb

## 2013-04-05 DIAGNOSIS — J449 Chronic obstructive pulmonary disease, unspecified: Secondary | ICD-10-CM

## 2013-04-05 DIAGNOSIS — J4489 Other specified chronic obstructive pulmonary disease: Secondary | ICD-10-CM

## 2013-04-05 DIAGNOSIS — IMO0001 Reserved for inherently not codable concepts without codable children: Secondary | ICD-10-CM

## 2013-04-05 MED ORDER — ALBUTEROL SULFATE (2.5 MG/3ML) 0.083% IN NEBU: 2.5000 mg | INHALATION_SOLUTION | Freq: Four times a day (QID) | RESPIRATORY_TRACT | Status: DC | PRN

## 2013-04-05 MED ORDER — COMPRESSOR/NEBULIZER MISC: Status: AC

## 2013-04-05 NOTE — Progress Notes (Signed)
41 [name] former smoker followed for asthma, COPD  LOV- 09/11/09    Wife here He describes persistent cough, improved with Spiriva but productive of yellow sputum. They got rid of their dog and moved to a new home to get away from mold and mildew. Overall he had done much better until this episode. Taking occasional loratadine. Comes today to refill Spiriva. We discussed evaluation of his status. His wife is concerned and asks that we update his PFT. He denies fever, blood, chest pain or palpitation, swelling or nodes. PFT: 05/04/2009-moderate obstructive airways disease with insignificant response to bronchodilator, air trapping, diffusion mildly reduced. FEV1 2.26/73%, FEV1/FVC 0.50, DLCO 71%.  01/14/2012 Post Hospital  Discharged 2 weeks ago for PNA at [facility] . No records available.  Finished all the abx and steroid taper. Feeling better, but still has cough. Mainly dry.  Of note on ACE inhibitor.  No fever or chest pain .   02/25/12- [name] former smoker followed for asthma, COPD States about the same; slight wheezing and cough, denies any SOB, chest tightness, or chest pain. Hospitalized for pneumonia at [facility]. Occasional productive cough, never blood. Uses Alka-Seltzer Plus occasionally for chest congestion. Much dry cough-we discussed lisinopril. Spiriva helps. COPD assessment test (CAT) 24/40  10/01/12- [name] former smoker followed for asthma, COPD FOLLOWS FOR: has not had Sprivia in about 3 days due to money; can tell his breathing has worsened since. Increased SOB. He got through the winter okay. Now out of Spiriva which has helped. Price is a problem. We discussed alternate day Spiriva or switch to Atrovent inhaler.  CXR 01/21/12 IMPRESSION:  COPD. Stable bibasilar linear scarring. No definite active  process.  Original Report Authenticated By: Juline Patch, M.D.  04/05/13- 35 [name] former smoker followed for asthma, COPD FOLLOWS FOR: when active-"can't breathe"-not able to get  enough air. If relaxed its okay. He considers this baseline. Wife says he gets short of breath and diaphoretic going out the trash can. He can't afford Spiriva on time each month so uses it every other day. Does use nebulizer  ROS-see HPI Constitutional:   No-   weight loss, night sweats, fevers, chills, fatigue, lassitude. HEENT:   No-  headaches, difficulty swallowing, tooth/dental problems, sore throat,       No-  sneezing, itching, ear ache, +nasal congestion, post nasal drip,  CV:  No-   chest pain, orthopnea, PND, swelling in lower extremities, anasarca, dizziness, palpitations Resp: +  shortness of breath with exertion or at rest.              No-   productive cough,  + non-productive cough,  No- coughing up of blood.              No-   change in color of mucus.  No- wheezing.   Skin: No-   rash or lesions. GI:  No-   heartburn, indigestion, abdominal pain, nausea, vomiting,  GU:  MS:  No-   joint pain or swelling.   Neuro-     nothing unusual Psych:  No- change in mood or affect. No depression or anxiety.  No memory loss.  OBJ- Physical Exam General- Alert, Oriented, Affect-appropriate, Distress- none acute. Obese Skin- rash-none, lesions- none, excoriation- none Lymphadenopathy- none Head- atraumatic            Eyes- Gross vision intact, PERRLA, conjunctivae and secretions clear            Ears- Hearing, canals-normal  Nose- Clear, no-Septal dev, mucus, polyps, erosion, perforation             Throat- Mallampati II , mucosa clear , drainage- none, tonsils- atrophic Neck- flexible , trachea midline, no stridor , thyroid nl, carotid no bruit Chest - symmetrical excursion , unlabored           Heart/CV- RRR , no murmur , no gallop  , no rub, nl s1 s2                           - JVD- none , edema- none, stasis changes- none, varices- none           Lung- clear, unlabored, wheeze- none, cough+loose , dullness-none, rub- none           Chest wall-  Abd-  Br/ Gen/  Rectal- Not done, not indicated Extrem- cyanosis- none, clubbing, none, atrophy- none, strength- nl Neuro- +pill-rolling tremor

## 2013-04-05 NOTE — Patient Instructions (Addendum)
[facility]  [facility]  For dx COPD with chronic bronchitis  Order- schedule 6 MWT on room air   Dx COPD w/ bronchitis             - ONOX on room air

## 2013-04-09 ENCOUNTER — Other Ambulatory Visit: Payer: Self-pay | Admitting: Internal Medicine

## 2013-04-09 DIAGNOSIS — R0602 Shortness of breath: Secondary | ICD-10-CM

## 2013-04-09 DIAGNOSIS — J449 Chronic obstructive pulmonary disease, unspecified: Secondary | ICD-10-CM

## 2013-04-14 ENCOUNTER — Telehealth: Payer: Self-pay | Admitting: Internal Medicine

## 2013-04-14 MED ORDER — ALBUTEROL SULFATE (2.5 MG/3ML) 0.083% IN NEBU: 2.5000 mg | INHALATION_SOLUTION | Freq: Four times a day (QID) | RESPIRATORY_TRACT | Status: DC | PRN

## 2013-04-14 NOTE — Telephone Encounter (Signed)
04/05/13 OV visit, Dr. Maple Hudson set pt up with nebs.  I spoke with Melissa. She stated when they called to speak with pt, the spouse answered and stated refuse their services. Was not told why. I called and spoke with pt. He stated [facility] was trying to set them up with the nebulizer machine and he does not need the machine just the albuterol medication for the machine. I advised him will make [facility] aware. I called Melissa back and she stated then the medication would have not been sent them. Shows RX was printed out. Efraim Kaufmann is going to call [facility] and see if they have this for [place] and call me back. Will await call back.

## 2013-04-14 NOTE — Telephone Encounter (Signed)
I called [facility]. I was advised pt has HMO and no out of network benefits by [facility]. He will need to get RX from local pharmacy.   I called and spoke with pt. He stated to send [facility] to [facility] for him. I have done so and nothing further needed

## 2013-04-16 ENCOUNTER — Encounter: Payer: Self-pay | Admitting: Internal Medicine

## 2013-04-16 NOTE — Assessment & Plan Note (Signed)
We discussed exercise tolerance and medication cost. He may need home oxygen Plan-home nebulizer with albuterol, overnight oximetry, 6 minute walk test, flu vaccine

## 2013-05-10 ENCOUNTER — Encounter: Payer: Self-pay | Admitting: Internal Medicine

## 2013-10-05 ENCOUNTER — Ambulatory Visit: Payer: Medicare Other | Admitting: Internal Medicine

## 2013-10-05 ENCOUNTER — Ambulatory Visit: Payer: Medicare Other

## 2013-10-18 ENCOUNTER — Telehealth: Payer: Self-pay | Admitting: Internal Medicine

## 2013-10-18 NOTE — Telephone Encounter (Signed)
callled and spoke with pts wife and she stated that [facility]  And that she has all of the copies of all of the tests that were done.  They were told to follow up sooner in our office.  pts wife is aware to bring all copies of these tests.  Per CY--ok for the pt to come in for appt with TP---this has been scheduled on Friday at 10:30 and the pts wife is aware.

## 2013-10-22 ENCOUNTER — Encounter: Payer: Self-pay | Admitting: Adult Health

## 2013-10-22 ENCOUNTER — Ambulatory Visit (INDEPENDENT_AMBULATORY_CARE_PROVIDER_SITE_OTHER): Payer: Medicare Other | Admitting: Adult Health

## 2013-10-22 VITALS — BP 108/66 | HR 73 | Temp 97.4°F | Ht 72.0 in | Wt 251.2 lb

## 2013-10-22 DIAGNOSIS — J449 Chronic obstructive pulmonary disease, unspecified: Secondary | ICD-10-CM

## 2013-10-22 NOTE — Progress Notes (Signed)
[name] former smoker followed for asthma, COPD  LOV- 09/11/09    Wife here He describes persistent cough, improved with Spiriva but productive of yellow sputum. They got rid of their dog and moved to a new home to get away from mold and mildew. Overall he had done much better until this episode. Taking occasional loratadine. Comes today to refill Spiriva. We discussed evaluation of his status. His wife is concerned and asks that we update his PFT. He denies fever, blood, chest pain or palpitation, swelling or nodes. PFT: 05/04/2009-moderate obstructive airways disease with insignificant response to bronchodilator, air trapping, diffusion mildly reduced. FEV1 2.26/73%, FEV1/FVC 0.50, DLCO 71%.  01/14/2012 Post Hospital  [facility] . No records available.  Finished all the abx and steroid taper. Feeling better, but still has cough. Mainly dry.  Of note on ACE inhibitor.  No fever or chest pain .   02/25/12- [name] former smoker followed for asthma, COPD States about the same; slight wheezing and cough, denies any SOB, chest tightness, or chest pain. [facility] Occasional productive cough, never blood. Uses Alka-Seltzer Plus occasionally for chest congestion. Much dry cough-we discussed lisinopril. Spiriva helps. COPD assessment test (CAT) 24/40  10/01/12- [name] former smoker followed for asthma, COPD FOLLOWS FOR: has not had Sprivia in about 3 days due to money; can tell his breathing has worsened since. Increased SOB. He got through the winter okay. Now out of Spiriva which has helped. Price is a problem. We discussed alternate day Spiriva or switch to Atrovent inhaler.  CXR 01/21/12 IMPRESSION:  COPD. Stable bibasilar linear scarring. No definite active  process.  Original Report Authenticated By: Juline PatchPAUL D. BARRY, M.D.  04/05/13- [name] former smoker followed for asthma, COPD FOLLOWS FOR: when active-"can't breathe"-not able to get  enough air. If relaxed its okay. He considers this baseline. Wife says he gets short of breath and diaphoretic going out the trash can. He can't afford Spiriva on time each month so uses it every other day. Does use nebulizer  10/22/2013 Follow up -Mod COPD w/ asthma component  Returns for follow up  Recent hospitalization for chest pain and HTN. Records reviewed from [facility] with neg card enzymes. CXR w/ no acute findings.  He says his  breathing is doing well since discharge but does report some occ cough with gray-to-yellow mucus from time to time. Main issue is gets winded with walking, wears out easily. Wt continues to climb , now around ~250lbs.  On O2 At bedtime .  Continue on Spiriva. daily  On ACE -has some cough but says not that bad.  Previous PFT in 2313 showed airflow obstruction w/ positive BD response.    ROS-see HPI Constitutional:   No-   weight loss, night sweats, fevers, chills, + fatigue, lassitude. HEENT:   No-  headaches, difficulty swallowing, tooth/dental problems, sore throat,       No-  sneezing, itching, ear ache, +nasal congestion, post nasal drip,  CV:  No-   chest pain, orthopnea, PND, swelling in lower extremities, anasarca, dizziness, palpitations Resp: +  shortness of breath with exertion or at rest.              No-   productive cough,  + non-productive cough,  No- coughing up of blood.              No-   change in color of mucus.  No- wheezing.  Skin: No-   rash or lesions. GI:  No-   heartburn, indigestion, abdominal pain, nausea, vomiting,  GU:  MS:  No-   joint pain or swelling.   Neuro-     nothing unusual Psych:  No- change in mood or affect. No depression or anxiety.  No memory loss.  OBJ- Physical Exam GEN: A/Ox3; pleasant , NAD, morbidly obese, elderly   HEENT:  [place]/AT,  EACs-clear, TMs-wnl, NOSE-clear, THROAT-clear, no lesions, no postnasal drip or exudate noted.   NECK:  Supple w/ fair ROM; no JVD; normal carotid impulses w/o bruits; no  thyromegaly or nodules palpated; no lymphadenopathy.  RESP  Diminished BS, no wheezing no accessory muscle use, no dullness to percussion  CARD:  RRR, no m/r/g  , tr peripheral edema, pulses intact, no cyanosis or clubbing.  GI:   Soft & nt; nml bowel sounds; no organomegaly or masses detected.obese abd   Musco: Warm bil, no deformities or joint swelling noted.   Neuro: alert, no focal deficits noted.    Skin: Warm, no lesions or rashes

## 2013-10-22 NOTE — Patient Instructions (Signed)
Begin Breo 1 puff daily .  Need to advance acitivity/exercise as tolerated. Consider walking in water that would be easier on your joints.  Follow up Dr. Maple HudsonYoung  In 4  weeks and As needed   Discuss with family doctor regarding your blood pressure pill -Lisinopril may be causing a persistent/recurrent cough .

## 2013-10-27 NOTE — Assessment & Plan Note (Addendum)
COPD - Trial of adding BREO to spiriva   May need to change ACE inhibitor if cough persists.  Wt loss is imperative  plan  Begin Breo 1 puff daily .  Need to advance acitivity/exercise as tolerated. Consider walking in water that would be easier on your joints.  Follow up Dr. Maple HudsonYoung  In 4  weeks and As needed   Discuss with family doctor regarding your blood pressure pill -Lisinopril may be causing a persistent/recurrent cough .

## 2013-11-05 ENCOUNTER — Ambulatory Visit: Payer: Medicare Other

## 2013-11-05 ENCOUNTER — Ambulatory Visit: Payer: Medicare Other | Admitting: Internal Medicine

## 2014-09-05 ENCOUNTER — Ambulatory Visit (INDEPENDENT_AMBULATORY_CARE_PROVIDER_SITE_OTHER): Payer: Medicare HMO | Admitting: Internal Medicine

## 2014-09-05 ENCOUNTER — Ambulatory Visit (INDEPENDENT_AMBULATORY_CARE_PROVIDER_SITE_OTHER)
Admission: RE | Admit: 2014-09-05 | Discharge: 2014-09-05 | Disposition: A | Discharge status: Home / Self Care | Payer: Medicare HMO | Source: Ambulatory Visit | Attending: Internal Medicine | Admitting: Internal Medicine

## 2014-09-05 ENCOUNTER — Encounter: Payer: Self-pay | Admitting: Internal Medicine

## 2014-09-05 ENCOUNTER — Encounter (INDEPENDENT_AMBULATORY_CARE_PROVIDER_SITE_OTHER): Payer: Self-pay

## 2014-09-05 VITALS — BP 138/66 | HR 64 | Ht 72.0 in | Wt 254.2 lb

## 2014-09-05 DIAGNOSIS — J45901 Unspecified asthma with (acute) exacerbation: Secondary | ICD-10-CM

## 2014-09-05 DIAGNOSIS — J449 Chronic obstructive pulmonary disease, unspecified: Secondary | ICD-10-CM

## 2014-09-05 MED ORDER — LEVALBUTEROL HCL 0.63 MG/3ML IN NEBU
0.6300 mg | INHALATION_SOLUTION | Freq: Once | RESPIRATORY_TRACT | Status: AC
  Administered 2014-09-05: 0.63 mg via RESPIRATORY_TRACT

## 2014-09-05 MED ORDER — METHYLPREDNISOLONE ACETATE 80 MG/ML IJ SUSP
80.0000 mg | Freq: Once | INTRAMUSCULAR | Status: AC
  Administered 2014-09-05: 80 mg via INTRAMUSCULAR

## 2014-09-05 MED ORDER — PREDNISONE 10 MG PO TABS: ORAL_TABLET | ORAL | Status: DC

## 2014-09-05 MED ORDER — AZITHROMYCIN 250 MG PO TABS: ORAL_TABLET | ORAL | Status: DC

## 2014-09-05 NOTE — Progress Notes (Signed)
[name] former smoker followed for asthma, COPD  LOV- 09/11/09    Wife here He describes persistent cough, improved with Spiriva but productive of yellow sputum. They got rid of their dog and moved to a new home to get away from mold and mildew. Overall he had done much better until this episode. Taking occasional loratadine. Comes today to refill Spiriva. We discussed evaluation of his status. His wife is concerned and asks that we update his PFT. He denies fever, blood, chest pain or palpitation, swelling or nodes. PFT: 05/04/2009-moderate obstructive airways disease with insignificant response to bronchodilator, air trapping, diffusion mildly reduced. FEV1 2.26/73%, FEV1/FVC 0.50, DLCO 71%.  01/14/2012 Post Hospital  [facility] . No records available.  Finished all the abx and steroid taper. Feeling better, but still has cough. Mainly dry.  Of note on ACE inhibitor.  No fever or chest pain .   02/25/12- [name] former smoker followed for asthma, COPD States about the same; slight wheezing and cough, denies any SOB, chest tightness, or chest pain. [facility] Occasional productive cough, never blood. Uses Alka-Seltzer Plus occasionally for chest congestion. Much dry cough-we discussed lisinopril. Spiriva helps. COPD assessment test (CAT) 24/40  10/01/12- [name] former smoker followed for asthma, COPD FOLLOWS FOR: has not had Sprivia in about 3 days due to money; can tell his breathing has worsened since. Increased SOB. He got through the winter okay. Now out of Spiriva which has helped. Price is a problem. We discussed alternate day Spiriva or switch to Atrovent inhaler. CXR 01/21/12 IMPRESSION:  COPD. Stable bibasilar linear scarring. No definite active  process.  Original Report Authenticated By: Juline PatchPAUL D. BARRY, M.D.  04/05/13- [name] former smoker followed for asthma, COPD FOLLOWS FOR: when active-"can't breathe"-not able to get enough  air. If relaxed its okay. He considers this baseline. Wife says he gets short of breath and diaphoretic going out the trash can. He can't afford Spiriva on time each month so uses it every other day. Does use nebulizer  10/22/2013 Follow up -Mod COPD w/ asthma component  Returns for follow up  Recent hospitalization for chest pain and HTN. Records reviewed from [facility] with neg card enzymes. CXR w/ no acute findings.  He says his  breathing is doing well since discharge but does report some occ cough with gray-to-yellow mucus from time to time. Main issue is gets winded with walking, wears out easily. Wt continues to climb , now around ~250lbs.  On O2 At bedtime .  Continue on Spiriva. daily  On ACE -has some cough but says not that bad.  Previous PFT in 2313 showed airflow obstruction w/ positive BD response.   09/05/14- [name] former smoker followed for asthma, COPD FOLLOWS FOR: Pt states he is having trouble with breathing-increased SOB and wheezing-worse activity x 10 days. We had called cefdinir which he refilled. Sputum originally green but has now cleared. Still feels congested in head and chest without fever. Uses nebulizer yesterday. Daily Spiriva but infrequent rescue inhaler.  ROS-see HPI   Negative unless "+" Constitutional:    weight loss, night sweats, fevers, chills, fatigue, lassitude. HEENT:    headaches, difficulty swallowing, tooth/dental problems, sore throat,       sneezing, itching, ear ache, nasal congestion, post nasal drip, snoring CV:    chest pain, orthopnea, PND, swelling in lower extremities, anasarca,  dizziness, palpitations Resp:   shortness of breath with exertion or at rest.                productive cough,   non-productive cough, coughing up of blood.              change in color of mucus.  wheezing.   Skin:    rash or lesions.  GI:  No-   heartburn, indigestion, abdominal pain, nausea,  GU:  MS:   joint pain, stiffness,   Neuro-     nothing unusual Psych:  change in mood or affect.  depression or anxiety.   memory loss.  OBJ- Physical Exam General- Alert, Oriented, Affect-appropriate, Distress- none acute, + obese Skin- rash-none, lesions- none, excoriation- none Lymphadenopathy- none Head- atraumatic            Eyes- Gross vision intact, PERRLA, conjunctivae and secretions clear            Ears- Hearing, canals-normal            Nose- Clear, no-Septal dev, mucus, polyps, erosion, perforation             Throat- Mallampati II , mucosa clear , drainage- none, tonsils- atrophic Neck- flexible , trachea midline, no stridor , thyroid nl, carotid no bruit Chest - symmetrical excursion , unlabored           Heart/CV- RRR , no murmur , no gallop  , no rub, nl s1 s2                           - JVD- none , edema- none, stasis changes- none, varices- none           Lung- + coarse rhonchi R>L, wheeze- none, cough + raspy , dullness-none, rub- none           Chest wall-  Abd-  Br/ Gen/ Rectal- Not done, not indicated Extrem- cyanosis- none, clubbing, none, atrophy- none, strength- nl Neuro- +tremor

## 2014-09-05 NOTE — Patient Instructions (Signed)
Neb xop 0.63  Depo 80  Script sent for Zpak antibiotic  Script sent for prednisone taper  Order- CXR   Dx asthma with bronchitis exacerbation  Order- schedule PFT

## 2014-09-07 NOTE — Assessment & Plan Note (Addendum)
Acute upper respiratory infection with acute bronchitis Plan-nebulizer treatments Xopenex, Depo-Medrol, chest x-ray, Z-Pak, CBC with differential, schedule PFT

## 2014-09-09 ENCOUNTER — Telehealth: Payer: Self-pay | Admitting: Internal Medicine

## 2014-09-09 NOTE — Telephone Encounter (Signed)
Pt's wife, Liborio NixonJanice had a question about whether or not we prescribed an inhaler when he was here on 09/05/14. Went over pt's instructions with Liborio NixonJanice from the 09/05/14. Nothing further was needed.

## 2014-11-04 ENCOUNTER — Other Ambulatory Visit: Payer: Self-pay | Admitting: Internal Medicine

## 2014-11-04 DIAGNOSIS — R0600 Dyspnea, unspecified: Secondary | ICD-10-CM

## 2014-11-07 ENCOUNTER — Ambulatory Visit (INDEPENDENT_AMBULATORY_CARE_PROVIDER_SITE_OTHER): Payer: Medicare HMO | Admitting: Internal Medicine

## 2014-11-07 ENCOUNTER — Encounter: Payer: Self-pay | Admitting: Internal Medicine

## 2014-11-07 VITALS — BP 122/78 | HR 63 | Ht 72.0 in | Wt 208.0 lb

## 2014-11-07 DIAGNOSIS — R0600 Dyspnea, unspecified: Secondary | ICD-10-CM | POA: Diagnosis not present

## 2014-11-07 DIAGNOSIS — J449 Chronic obstructive pulmonary disease, unspecified: Secondary | ICD-10-CM | POA: Diagnosis not present

## 2014-11-07 LAB — PULMONARY FUNCTION TEST
DL/VA % pred: 65 %
DL/VA: 3.08 ml/min/mmHg/L
DLCO UNC % PRED: 51 %
DLCO unc: 18.04 ml/min/mmHg
FEF 25-75 POST: 1.27 L/s
FEF 25-75 Pre: 0.66 L/sec
FEF2575-%CHANGE-POST: 93 %
FEF2575-%Pred-Post: 51 %
FEF2575-%Pred-Pre: 26 %
FEV1-%Change-Post: 24 %
FEV1-%PRED-POST: 54 %
FEV1-%Pred-Pre: 44 %
FEV1-POST: 1.86 L
FEV1-Pre: 1.49 L
FEV1FVC-%Change-Post: -1 %
FEV1FVC-%Pred-Pre: 74 %
FEV6-%Change-Post: 25 %
FEV6-%Pred-Post: 75 %
FEV6-%Pred-Pre: 60 %
FEV6-Post: 3.32 L
FEV6-Pre: 2.64 L
FEV6FVC-%Change-Post: -1 %
FEV6FVC-%PRED-POST: 101 %
FEV6FVC-%Pred-Pre: 103 %
FVC-%CHANGE-POST: 26 %
FVC-%PRED-PRE: 59 %
FVC-%Pred-Post: 75 %
FVC-POST: 3.49 L
FVC-PRE: 2.75 L
PRE FEV1/FVC RATIO: 54 %
Post FEV1/FVC ratio: 53 %
Post FEV6/FVC ratio: 95 %
Pre FEV6/FVC Ratio: 97 %
RV % pred: 173 %
RV: 4.55 L
TLC % pred: 102 %
TLC: 7.63 L

## 2014-11-07 MED ORDER — FLUTICASONE FUROATE-VILANTEROL 100-25 MCG/INH IN AEPB
1.0000 | INHALATION_SPRAY | Freq: Every day | RESPIRATORY_TRACT | Status: AC
Start: 2014-11-07 — End: 2014-11-08

## 2014-11-07 MED ORDER — FLUTICASONE FUROATE-VILANTEROL 100-25 MCG/INH IN AEPB
INHALATION_SPRAY | RESPIRATORY_TRACT | Status: DC
Start: 2014-11-07 — End: 2016-04-15

## 2014-11-07 MED ORDER — ALBUTEROL SULFATE HFA 108 (90 BASE) MCG/ACT IN AERS
INHALATION_SPRAY | RESPIRATORY_TRACT | Status: AC
Start: 2014-11-07 — End: ?

## 2014-11-07 NOTE — Patient Instructions (Signed)
Script for Proair albuterol rescue inhaler to use if needed  Encourage use of your nebulizer treatment up to every 4-6 hours, as an alternative to the rescue inhaler, when you can  Order- Change DME for O2 2L sleep, because current DME not covered by insurance.   Sample and script for Breo 100 inhaler to use as maintenance inhaler, 1 puff then rinse mouth, once daily

## 2014-11-07 NOTE — Progress Notes (Signed)
[name] former smoker followed for asthma, COPD  LOV- 09/11/09    Wife here He describes persistent cough, improved with Spiriva but productive of yellow sputum. They got rid of their dog and moved to a new home to get away from mold and mildew. Overall he had done much better until this episode. Taking occasional loratadine. Comes today to refill Spiriva. We discussed evaluation of his status. His wife is concerned and asks that we update his PFT. He denies fever, blood, chest pain or palpitation, swelling or nodes. PFT: 05/04/2009-moderate obstructive airways disease with insignificant response to bronchodilator, air trapping, diffusion mildly reduced. FEV1 2.26/73%, FEV1/FVC 0.50, DLCO 71%.  01/14/2012 Post Hospital  [facility] . No records available.  Finished all the abx and steroid taper. Feeling better, but still has cough. Mainly dry.  Of note on ACE inhibitor.  No fever or chest pain .   02/25/12- [name] former smoker followed for asthma, COPD States about the same; slight wheezing and cough, denies any SOB, chest tightness, or chest pain. [facility]. Occasional productive cough, never blood. Uses Alka-Seltzer Plus occasionally for chest congestion. Much dry cough-we discussed lisinopril. Spiriva helps. COPD assessment test (CAT) 24/40  10/01/12- [name] former smoker followed for asthma, COPD FOLLOWS FOR: has not had Sprivia in about 3 days due to money; can tell his breathing has worsened since. Increased SOB. He got through the winter okay. Now out of Spiriva which has helped. Price is a problem. We discussed alternate day Spiriva or switch to Atrovent inhaler. CXR 01/21/12 IMPRESSION:  COPD. Stable bibasilar linear scarring. No definite active  process.  Original Report Authenticated By: Juline PatchPAUL D. BARRY, M.D.  04/05/13- [name] former smoker followed for asthma, COPD FOLLOWS FOR: when active-"can't breathe"-not able to get enough  air. If relaxed its okay. He considers this baseline. Wife says he gets short of breath and diaphoretic going out the trash can. He can't afford Spiriva on time each month so uses it every other day. Does use nebulizer  10/22/2013 Follow up -Mod COPD w/ asthma component  Returns for follow up  Recent hospitalization for chest pain and HTN. Records reviewed from [facility] with neg card enzymes. CXR w/ no acute findings.  He says his  breathing is doing well since discharge but does report some occ cough with gray-to-yellow mucus from time to time. Main issue is gets winded with walking, wears out easily. Wt continues to climb , now around ~250lbs.  On O2 At bedtime .  Continue on Spiriva. daily  On ACE -has some cough but says not that bad.  Previous PFT in 2313 showed airflow obstruction w/ positive BD response.   09/05/14- [name] former smoker followed for asthma, COPD FOLLOWS FOR: Pt states he is having trouble with breathing-increased SOB and wheezing-worse activity x 10 days. We had called cefdinir which he refilled. Sputum originally green but has now cleared. Still feels congested in head and chest without fever. Uses nebulizer yesterday. Daily Spiriva but infrequent rescue inhaler.  11/07/14- [name] former smoker followed for asthma, COPD FOLLOWS FOR: Review PFT with patient; pt's wife states patients breathing is not so good and he has now started wearing his O2. Wife here Easier dyspnea on exertion with no acute or specific event. Not using Breo and irregular but using Spiriva. He says his nebulizer machine takes too much time to bother with. We discussed the purpose of these medicines.  PFT 11/07/2014-severe obstructive airways disease with significant response to bronchodilator. FEV1 1.86/54% predicted, DLCO 51% CXR 09/06/14 IMPRESSION: Underlying emphysema. Areas of apical and basilar scarring bilaterally. No edema or consolidation. Electronically Signed  By: Bretta BangWilliam Woodruff III  M.D.  On: 09/06/2014 08:56  ROS-see HPI   Negative unless "+" Constitutional:    weight loss, night sweats, fevers, chills, fatigue, lassitude. HEENT:    headaches, difficulty swallowing, tooth/dental problems, sore throat,       sneezing, itching, ear ache, nasal congestion, post nasal drip, snoring CV:    chest pain, orthopnea, PND, swelling in lower extremities, anasarca,                                  dizziness, palpitations Resp:   + shortness of breath with exertion or at rest.                productive cough,   non-productive cough, coughing up of blood.              change in color of mucus.  wheezing.   Skin:    rash or lesions.  GI:  No-   heartburn, indigestion, abdominal pain, nausea,  GU:  MS:   joint pain, stiffness,  Neuro-     nothing unusual Psych:  change in mood or affect.  depression or anxiety.   memory loss.  OBJ- Physical Exam General- Alert, Oriented, Affect-appropriate, Distress- none acute, + obese Skin- rash-none, lesions- none, excoriation- none Lymphadenopathy- none Head- atraumatic            Eyes- Gross vision intact, PERRLA, conjunctivae and secretions clear            Ears- Hearing, canals-normal            Nose- Clear, no-Septal dev, mucus, polyps, erosion, perforation             Throat- Mallampati II , mucosa clear , drainage- none, tonsils- atrophic Neck- flexible , trachea midline, no stridor , thyroid nl, carotid no bruit Chest - symmetrical excursion , unlabored           Heart/CV- RRR , no murmur , no gallop  , no rub, nl s1 s2                           - JVD- none , edema- none, stasis changes- none, varices- none           Lung- + coarse rhonchi R>L, wheeze- none, cough + raspy , dullness-none, rub- none           Chest wall-  Abd-  Br/ Gen/ Rectal- Not done, not indicated Extrem- cyanosis- none, clubbing, none, atrophy- none, strength- nl Neuro- + pill- rolling tremor

## 2014-11-07 NOTE — Progress Notes (Signed)
PFT done today. 

## 2014-11-12 NOTE — Assessment & Plan Note (Addendum)
He has not been using his bronchodilators as directed but has a significant reversible component based on PFT. I tried to explain this to him. Hypoxic respiratory failure Plan-change his home oxygen DME company to one that his insurance covers

## 2015-01-02 ENCOUNTER — Telehealth: Payer: Self-pay | Admitting: Internal Medicine

## 2015-01-02 ENCOUNTER — Ambulatory Visit (INDEPENDENT_AMBULATORY_CARE_PROVIDER_SITE_OTHER): Payer: Medicare HMO | Admitting: Adult Health

## 2015-01-02 ENCOUNTER — Encounter: Payer: Self-pay | Admitting: Adult Health

## 2015-01-02 VITALS — BP 140/78 | HR 68 | Temp 97.3°F | Wt 259.2 lb

## 2015-01-02 DIAGNOSIS — J9611 Chronic respiratory failure with hypoxia: Secondary | ICD-10-CM

## 2015-01-02 DIAGNOSIS — J449 Chronic obstructive pulmonary disease, unspecified: Secondary | ICD-10-CM | POA: Diagnosis not present

## 2015-01-02 DIAGNOSIS — J9610 Chronic respiratory failure, unspecified whether with hypoxia or hypercapnia: Secondary | ICD-10-CM | POA: Insufficient documentation

## 2015-01-02 MED ORDER — DOXYCYCLINE HYCLATE 100 MG PO TABS: 100.0000 mg | ORAL_TABLET | Freq: Two times a day (BID) | ORAL | Status: DC

## 2015-01-02 MED ORDER — PREDNISONE 10 MG PO TABS: ORAL_TABLET | ORAL | Status: DC

## 2015-01-02 MED ORDER — LEVALBUTEROL HCL 0.63 MG/3ML IN NEBU
0.6300 mg | INHALATION_SOLUTION | Freq: Once | RESPIRATORY_TRACT | Status: AC
  Administered 2015-01-02: 0.63 mg via RESPIRATORY_TRACT

## 2015-01-02 NOTE — Telephone Encounter (Signed)
Spoke with spouse. She reports pt is very SOB and refuse to go to the ED. Pt has been like this x 2 weeks. Pt is using his o2 2 liters 24/7.  C/o wheezing and chest tx, slight cough. Wants an ASAP appt with CDY. Please advise Dr. Maple Hudson thanks  Allergies  Allergen Reactions  . Hydrocodone     REACTIONS: hallucinations     Current Outpatient Prescriptions on File Prior to Visit  Medication Sig Dispense Refill  . albuterol (PROAIR HFA) 108 (90 BASE) MCG/ACT inhaler 2 puffs every 4-6 hours as needed- rescue 1 Inhaler prn  . albuterol (PROVENTIL) (2.5 MG/3ML) 0.083% nebulizer solution Take 3 mLs (2.5 mg total) by nebulization every 6 (six) hours as needed for wheezing or shortness of breath. DX 496 360 mL 12  . amLODipine (NORVASC) 5 MG tablet Take 5 mg by mouth at bedtime.    [name] aspirin 81 MG tablet Take 81 mg by mouth daily.    [name] atorvastatin (LIPITOR) 40 MG tablet Take 1 tablet by mouth daily.    . diclofenac (VOLTAREN) 75 MG EC tablet Take 75 mg by mouth 2 (two) times daily.    [name] etodolac (LODINE) 400 MG tablet Take 400 mg by mouth 2 (two) times daily.    . finasteride (PROSCAR) 5 MG tablet Take 1 tablet by mouth daily.    . Fluticasone Furoate-Vilanterol (BREO ELLIPTA) 100-25 MCG/INH AEPB 1 puff then rinse mouth, once daily 1 each prn  . isosorbide dinitrate (ISORDIL) 30 MG tablet Take 30 mg by mouth daily.    [name] lisinopril (PRINIVIL,ZESTRIL) 30 MG tablet Take 30 mg by mouth daily.    [name] lisinopril-hydrochlorothiazide (PRINZIDE,ZESTORETIC) 20-25 MG per tablet Take 1 tablet by mouth daily.    [name] loratadine (CLARITIN) 10 MG tablet Take 10 mg by mouth daily.    . metoprolol succinate (TOPROL-XL) 100 MG 24 hr tablet Take 100 mg by mouth daily.    . Multiple Vitamin (MULTIVITAMIN) tablet Take 1 tablet by mouth daily.    . Nebulizers (COMPRESSOR/NEBULIZER) MISC Use as directed 1 each 0  . nitroGLYCERIN (NITROSTAT) 0.4 MG SL tablet Place 0.4 mg under the tongue every 5 (five) minutes x 3 doses as  needed.    . Omega-3 Fatty Acids (FISH OIL) 1000 MG CAPS Take 1 capsule by mouth daily.    . Psyllium (METAMUCIL PO) Take by mouth as needed.    . simvastatin (ZOCOR) 80 MG tablet Take 80 mg by mouth at bedtime.    . sulfaSALAzine (AZULFIDINE) 500 MG tablet Take 500 mg by mouth 2 (two) times daily.    [name] tiotropium (SPIRIVA) 18 MCG inhalation capsule Place 1 capsule (18 mcg total) into inhaler and inhale daily. 10 capsule 0   No current facility-administered medications on file prior to visit.

## 2015-01-02 NOTE — Telephone Encounter (Signed)
Called spouse and appt scheduled for 3p today w/ TP. Nothing further needed

## 2015-01-02 NOTE — Addendum Note (Signed)
Addended by: Boone Master E on: 01/02/2015 05:48 PM   Modules accepted: Orders

## 2015-01-02 NOTE — Patient Instructions (Addendum)
Doxycycline 100mg  Twice daily  For 7 days .  Prednisone taper over next week.  Mucinex DM Twice daily  As needed  Cough/congestion  Oyxgen 2.5L with activity and At bedtime   Follow up Dr. Maple Hudson  2 months and As needed   Discuss with family doctor regarding your blood pressure pill -Lisinopril may be causing a persistent/recurrent cough .

## 2015-01-02 NOTE — Assessment & Plan Note (Signed)
Exacerbation  Plan  Doxycycline 100mg  Twice daily  For 7 days .  Prednisone taper over next week.  Mucinex DM Twice daily  As needed  Cough/congestion  Oyxgen 2.5L with activity and At bedtime   Follow up Dr. Maple Hudson  2 months and As needed   Discuss with family doctor regarding your blood pressure pill -Lisinopril may be causing a persistent/recurrent cough .

## 2015-01-02 NOTE — Assessment & Plan Note (Signed)
Oyxgen 2.5L with activity and At bedtime   Follow up Dr. Maple Hudson  2 months and As needed   Discuss with family doctor regarding your blood pressure pill -Lisinopril may be causing a persistent/recurrent cough .

## 2015-01-02 NOTE — Progress Notes (Signed)
   Subjective:    Patient ID: Joseph Castaneda, male    DOB: 1940-08-09, 74 y.o.   MRN: [id number]  HPI  23 [name] former smoker followed for asthma, COPD  PFT 11/07/2014-severe obstructive airways disease with significant response to bronchodilator. FEV1 1.86/54% predicted, DLCO 51%   01/02/2015 Acute OV  Complains of increased SOB, non-productive cough, and wheezing x 7 days.  Denies fever, chills, hemoptysis, n/v, and edema.  Remains on Spiriva and BREO . Says taking both .  BREO costs him more.   Pt states only using O2 at 2L prn.    Remains on ACE inhibitor.  Review of Systems Constitutional:   No  weight loss, night sweats,  Fevers, chills, fatigue, or  lassitude.  HEENT:   No headaches,  Difficulty swallowing,  Tooth/dental problems, or  Sore throat,                No sneezing, itching, ear ache,  +nasal congestion, post nasal drip,   CV:  No chest pain,  Orthopnea, PND, swelling in lower extremities, anasarca, dizziness, palpitations, syncope.   GI  No heartburn, indigestion, abdominal pain, nausea, vomiting, diarrhea, change in bowel habits, loss of appetite, bloody stools.   Resp:  .  No chest wall deformity  Skin: no rash or lesions.  GU: no dysuria, change in color of urine, no urgency or frequency.  No flank pain, no hematuria   MS:  No joint pain or swelling.  No decreased range of motion.  No back pain.  Psych:  No change in mood or affect. No depression or anxiety.  No memory loss.         Objective:   Physical Exam GEN: A/Ox3; pleasant , NAD, elderly and obese   HEENT:  Gratis/AT,  EACs-clear, TMs-wnl, NOSE-clear, THROAT-clear, no lesions, no postnasal drip or exudate noted.   NECK:  Supple w/ fair ROM; no JVD; normal carotid impulses w/o bruits; no thyromegaly or nodules palpated; no lymphadenopathy.  RESP   Few rhonchi, trace exp wheeze, no accessory muscle use, no dullness to percussion  CARD:  RRR, no m/r/g  , tr  peripheral edema, pulses intact,  no cyanosis or clubbing.  GI:   Soft & nt; nml bowel sounds; no organomegaly or masses detected.obese   Musco: Warm bil, no deformities or joint swelling noted.   Neuro: alert, no focal deficits noted.    Skin: Warm, no lesions or rashes         Assessment & Plan:

## 2015-01-02 NOTE — Telephone Encounter (Signed)
Per CY-lets have patient to come in to see TP today at 3:00pm. JJ has approved to use this slot on TP's schedule.

## 2015-03-16 ENCOUNTER — Encounter: Payer: Self-pay | Admitting: Internal Medicine

## 2015-03-16 ENCOUNTER — Ambulatory Visit (INDEPENDENT_AMBULATORY_CARE_PROVIDER_SITE_OTHER): Payer: Medicare HMO | Admitting: Internal Medicine

## 2015-03-16 VITALS — BP 110/60 | HR 71 | Ht 73.0 in | Wt 249.6 lb

## 2015-03-16 DIAGNOSIS — J449 Chronic obstructive pulmonary disease, unspecified: Secondary | ICD-10-CM | POA: Diagnosis not present

## 2015-03-16 DIAGNOSIS — J9611 Chronic respiratory failure with hypoxia: Secondary | ICD-10-CM | POA: Diagnosis not present

## 2015-03-16 MED ORDER — OLMESARTAN MEDOXOMIL 40 MG PO TABS: ORAL_TABLET | ORAL | Status: DC

## 2015-03-16 NOTE — Patient Instructions (Signed)
Script sent for benicar/ olmesartan 40 mg. Take 1/2 tab daily, instead of lisinopril. See if this helps your cough.                After a month, if your cough is no better with olmesartan, then ok to go back to lisinopril.

## 2015-03-16 NOTE — Progress Notes (Signed)
[name] former smoker followed for asthma, COPD  LOV- 09/11/09    Wife here He describes persistent cough, improved with Spiriva but productive of yellow sputum. They got rid of their dog and moved to a new home to get away from mold and mildew. Overall he had done much better until this episode. Taking occasional loratadine. Comes today to refill Spiriva. We discussed evaluation of his status. His wife is concerned and asks that we update his PFT. He denies fever, blood, chest pain or palpitation, swelling or nodes. PFT: 05/04/2009-moderate obstructive airways disease with insignificant response to bronchodilator, air trapping, diffusion mildly reduced. FEV1 2.26/73%, FEV1/FVC 0.50, DLCO 71%.  01/14/2012 Post Hospital  [facility] No records available.  Finished all the abx and steroid taper. Feeling better, but still has cough. Mainly dry.  Of note on ACE inhibitor.  No fever or chest pain .   02/25/12- [name] former smoker followed for asthma, COPD States about the same; slight wheezing and cough, denies any SOB, chest tightness, or chest pain. [facility] Occasional productive cough, never blood. Uses Alka-Seltzer Plus occasionally for chest congestion. Much dry cough-we discussed lisinopril. Spiriva helps. COPD assessment test (CAT) 24/40  10/01/12- [name] former smoker followed for asthma, COPD FOLLOWS FOR: has not had Sprivia in about 3 days due to money; can tell his breathing has worsened since. Increased SOB. He got through the winter okay. Now out of Spiriva which has helped. Price is a problem. We discussed alternate day Spiriva or switch to Atrovent inhaler. CXR 01/21/12 IMPRESSION:  COPD. Stable bibasilar linear scarring. No definite active  process.  Original Report Authenticated By: Juline PatchPAUL D. BARRY, M.D.  04/05/13- [name] former smoker followed for asthma, COPD FOLLOWS FOR: when active-"can't breathe"-not able to get enough  air. If relaxed its okay. He considers this baseline. Wife says he gets short of breath and diaphoretic going out the trash can. He can't afford Spiriva on time each month so uses it every other day. Does use nebulizer  10/22/2013 Follow up -Mod COPD w/ asthma component  Returns for follow up  Recent hospitalization for chest pain and HTN. Records reviewed from [facility] with neg card enzymes. CXR w/ no acute findings.  He says his  breathing is doing well since discharge but does report some occ cough with gray-to-yellow mucus from time to time. Main issue is gets winded with walking, wears out easily. Wt continues to climb , now around ~250lbs.  On O2 At bedtime .  Continue on Spiriva. daily  On ACE -has some cough but says not that bad.  Previous PFT in 2313 showed airflow obstruction w/ positive BD response.   09/05/14- [name] former smoker followed for asthma, COPD FOLLOWS FOR: Pt states he is having trouble with breathing-increased SOB and wheezing-worse activity x 10 days. We had called cefdinir which he refilled. Sputum originally green but has now cleared. Still feels congested in head and chest without fever. Uses nebulizer yesterday. Daily Spiriva but infrequent rescue inhaler.  11/07/14- [name] former smoker followed for asthma, COPD FOLLOWS FOR: Review PFT with patient; pt's wife states patients breathing is not so good and he has now started wearing his O2. Wife here Easier dyspnea on exertion with no acute or specific event. Not using Breo and irregular but using Spiriva. He says his nebulizer machine takes too much time to bother with. We discussed the purpose of these medicines.  PFT 11/07/2014-severe obstructive airways disease with significant response to bronchodilator. FEV1 1.86/54% predicted, DLCO 51% CXR 09/06/14 IMPRESSION: Underlying emphysema. Areas of apical and basilar scarring bilaterally. No edema or consolidation. Electronically Signed  By: Bretta Bang III  M.D.  On: 09/06/2014 08:56  01/02/2015 Acute OV  Complains of increased SOB, non-productive cough, and wheezing x 7 days.  Denies fever, chills, hemoptysis, n/v, and edema.  Remains on Spiriva and BREO . Says taking both .  BREO costs him more.  Pt states only using O2 at 2L prn.   Remains on ACE inhibitor.  03/16/15- 62 [name] former smoker followed for asthma, COPD, chronic respiratory failure O2 2.5 L sleep and exertion. Discussed possible lisinopril>cough. At LOV was increased O2 2L.> 2.5, prednisone taper, doxycycline. Still much cough with white sputum.  ROS-see HPI   Negative unless "+" Constitutional:    weight loss, night sweats, fevers, chills, fatigue, lassitude. HEENT:    headaches, difficulty swallowing, tooth/dental problems, sore throat,       sneezing, itching, ear ache, nasal congestion, post nasal drip, snoring CV:    chest pain, orthopnea, PND, swelling in lower extremities, anasarca,                                                      dizziness, palpitations Resp:   + shortness of breath with exertion or at rest.                +productive cough,   non-productive cough, coughing up of blood.              change in color of mucus.  wheezing.   Skin:    rash or lesions.  GI:  No-   heartburn, indigestion, abdominal pain, nausea,  GU:  MS:   joint pain, stiffness,  Neuro-     nothing unusual Psych:  change in mood or affect.  depression or anxiety.   memory loss.  OBJ- Physical Exam General- Alert, Oriented, Affect-appropriate, Distress- none acute, + obese Skin- rash-none, lesions- none, excoriation- none Lymphadenopathy- none Head- atraumatic            Eyes- Gross vision intact, PERRLA, conjunctivae and secretions clear            Ears- Hearing, canals-normal            Nose- Clear, no-Septal dev, mucus, polyps, erosion, perforation             Throat- Mallampati II , mucosa clear , drainage- none, tonsils- atrophic Neck- flexible , trachea midline, no stridor  , thyroid nl, carotid no bruit Chest - symmetrical excursion , unlabored           Heart/CV- RRR , no murmur , no gallop  , no rub, nl s1 s2                           - JVD- none , edema- none, stasis changes- none, varices- none           Lung- clear/ unlabored, wheeze- none, cough + raspy , dullness-none, rub- none           Chest wall-  Abd-  Br/ Gen/ Rectal- Not done, not indicated Extrem- cyanosis- none, clubbing, none, atrophy- none, strength- nl Neuro- + pill- rolling tremor

## 2015-04-03 NOTE — Assessment & Plan Note (Signed)
Persistent cough. Consider possibility it is caused by his ACE inhibitor Plan- Try 1 month change from lisinopril to Benicar to see if that helps cough

## 2015-05-11 ENCOUNTER — Ambulatory Visit: Payer: Medicare HMO | Admitting: Internal Medicine

## 2015-05-15 ENCOUNTER — Telehealth: Payer: Self-pay | Admitting: Internal Medicine

## 2015-05-15 NOTE — Telephone Encounter (Signed)
error 

## 2015-05-22 ENCOUNTER — Telehealth: Payer: Self-pay | Admitting: Internal Medicine

## 2015-05-22 NOTE — Telephone Encounter (Signed)
Spoke with Melissa. She reports pt will needs another OV, sats done again and order since this is after the 30 day window. Katie, where can pt be worked in at? thanks

## 2015-05-22 NOTE — Telephone Encounter (Signed)
Spoke with the pt's wife. States that [facility]. [facility] had the wrong insurance listed for the pt in their system. She was told that the pt will need an appointment with CY. [facility] States that the pt needs to be requalified for oxygen through the correct insurance company. Advised her that the pt was just seen on 03/16/15 by CY and asked if the pt would need to see him again or if we could just do the qualifying walk. She is going to check on this and call us back with an answer. The pt's wife is aware of the above information. Will await a [facility].

## 2015-05-23 ENCOUNTER — Telehealth: Payer: Self-pay | Admitting: Internal Medicine

## 2015-05-23 NOTE — Telephone Encounter (Signed)
Patient Instructions     Script sent for benicar/ olmesartan 40 mg. Take 1/2 tab daily, instead of lisinopril. See if this helps your cough.   After a month, if your cough is no better with olmesartan, then ok to go back to lisinopril.   Spoke with pt''s wife, wanted to know if pt needed to be taking benicar or lisinopril.  Reviewed the above avs from pt's last ov.  Pt's wife aware of recs.  Med list has been updated.  Nothing further needed.

## 2015-05-25 NOTE — Telephone Encounter (Signed)
Spoke with Melissa; even though [facility] had wrong insurance on file there is no way to get Insurance to accept ONO order without OV.    Monday11-14-16 at 2:00pm OV for patient to come in; discuss O2 usage at night and order ONO. Please inform patient of date and time. Thanks.

## 2015-05-25 NOTE — Telephone Encounter (Signed)
[facility]- need to discuss this matter with her.

## 2015-05-25 NOTE — Telephone Encounter (Signed)
Called spoke with spouse. Aware of appt date/time. Nothing further needed

## 2015-05-29 ENCOUNTER — Ambulatory Visit: Payer: Medicare HMO | Admitting: Internal Medicine

## 2015-06-02 ENCOUNTER — Telehealth: Payer: Self-pay | Admitting: Internal Medicine

## 2015-06-02 NOTE — Telephone Encounter (Signed)
Pt can be seen on Monday 06/05/15 at 1130am slot. Thanks.

## 2015-06-02 NOTE — Telephone Encounter (Signed)
Per 05/22/15 phone note: Ronny BaconKatie C Welchel, CMA at 05/25/2015 5:02 PM     Status: Signed       Expand All Collapse All   Spoke with Melissa; even though [facility] had wrong insurance on file there is no way to get Insurance to accept ONO order without OV.   Monday11-14-16 at 2:00pm OV for patient to come in; discuss O2 usage at night and order ONO. Please inform patient of date and time. Thanks.      ---  Pt was scheduled for OV w/ Dr. Maple HudsonYoung on 05/29/15 but NS. Spoke w/ spouse. She reports they had a death in the family and was not able to make it. Pt needs an ASAP OV with Dr. [facility]Young [facility] and also needs a walk test per spouse. Please advise Florentina Addisonkatie where pt can be worked in at? thanks

## 2015-06-02 NOTE — Telephone Encounter (Signed)
Patient scheduled to see Dr. Maple HudsonYoung on 06/05/15 at 11:30am. Patient notified. Nothing further needed. Closing encounter

## 2015-06-05 ENCOUNTER — Encounter: Payer: Self-pay | Admitting: Internal Medicine

## 2015-06-05 ENCOUNTER — Ambulatory Visit (INDEPENDENT_AMBULATORY_CARE_PROVIDER_SITE_OTHER): Payer: Medicare HMO | Admitting: Internal Medicine

## 2015-06-05 VITALS — BP 138/68 | HR 64 | Ht 73.0 in | Wt 249.8 lb

## 2015-06-05 DIAGNOSIS — J449 Chronic obstructive pulmonary disease, unspecified: Secondary | ICD-10-CM

## 2015-06-05 DIAGNOSIS — Z23 Encounter for immunization: Secondary | ICD-10-CM | POA: Diagnosis not present

## 2015-06-05 DIAGNOSIS — J9611 Chronic respiratory failure with hypoxia: Secondary | ICD-10-CM

## 2015-06-05 NOTE — Progress Notes (Signed)
[name] former smoker followed for asthma, COPD  LOV- 09/11/09    Wife here He describes persistent cough, improved with Spiriva but productive of yellow sputum. They got rid of their dog and moved to a new home to get away from mold and mildew. Overall he had done much better until this episode. Taking occasional loratadine. Comes today to refill Spiriva. We discussed evaluation of his status. His wife is concerned and asks that we update his PFT. He denies fever, blood, chest pain or palpitation, swelling or nodes. PFT: 05/04/2009-moderate obstructive airways disease with insignificant response to bronchodilator, air trapping, diffusion mildly reduced. FEV1 2.26/73%, FEV1/FVC 0.50, DLCO 71%.  01/14/2012 Post Hospital  [facility] . No records available.  Finished all the abx and steroid taper. Feeling better, but still has cough. Mainly dry.  Of note on ACE inhibitor.  No fever or chest pain .   02/25/12- [name] former smoker followed for asthma, COPD States about the same; slight wheezing and cough, denies any SOB, chest tightness, or chest pain. [facility]. Occasional productive cough, never blood. Uses Alka-Seltzer Plus occasionally for chest congestion. Much dry cough-we discussed lisinopril. Spiriva helps. COPD assessment test (CAT) 24/40  10/01/12- [name] former smoker followed for asthma, COPD FOLLOWS FOR: has not had Sprivia in about 3 days due to money; can tell his breathing has worsened since. Increased SOB. He got through the winter okay. Now out of Spiriva which has helped. Price is a problem. We discussed alternate day Spiriva or switch to Atrovent inhaler. CXR 01/21/12 IMPRESSION:  COPD. Stable bibasilar linear scarring. No definite active  process.  Original Report Authenticated By: Juline PatchPAUL D. BARRY, M.D.  04/05/13- [name] former smoker followed for asthma, COPD FOLLOWS FOR: when active-"can't breathe"-not able to get enough  air. If relaxed its okay. He considers this baseline. Wife says he gets short of breath and diaphoretic going out the trash can. He can't afford Spiriva on time each month so uses it every other day. Does use nebulizer  10/22/2013 Follow up -Mod COPD w/ asthma component  Returns for follow up  Recent hospitalization for chest pain and HTN. Records reviewed from [facility] with neg card enzymes. CXR w/ no acute findings.  He says his  breathing is doing well since discharge but does report some occ cough with gray-to-yellow mucus from time to time. Main issue is gets winded with walking, wears out easily. Wt continues to climb , now around ~250lbs.  On O2 At bedtime .  Continue on Spiriva. daily  On ACE -has some cough but says not that bad.  Previous PFT in 2313 showed airflow obstruction w/ positive BD response.   09/05/14- [name] former smoker followed for asthma, COPD FOLLOWS FOR: Pt states he is having trouble with breathing-increased SOB and wheezing-worse activity x 10 days. We had called cefdinir which he refilled. Sputum originally green but has now cleared. Still feels congested in head and chest without fever. Uses nebulizer yesterday. Daily Spiriva but infrequent rescue inhaler.  11/07/14- [name] former smoker followed for asthma, COPD FOLLOWS FOR: Review PFT with patient; pt's wife states patients breathing is not so good and he has now started wearing his O2. Wife here Easier dyspnea on exertion with no acute or specific event. Not using Breo and irregular but using Spiriva. He says his nebulizer machine takes too much time to bother with. We discussed the purpose of these medicines.  PFT 11/07/2014-severe obstructive airways disease with significant response to bronchodilator. FEV1 1.86/54% predicted, DLCO 51% CXR 09/06/14 IMPRESSION: Underlying emphysema. Areas of apical and basilar scarring bilaterally. No edema or consolidation. Electronically Signed  By: Bretta Bang III  M.D.  On: 09/06/2014 08:56  01/02/2015 Acute OV  Complains of increased SOB, non-productive cough, and wheezing x 7 days.  Denies fever, chills, hemoptysis, n/v, and edema.  Remains on Spiriva and BREO . Says taking both .  BREO costs him more.  Pt states only using O2 at 2L prn.   Remains on ACE inhibitor.  03/16/15- 12 [name] former smoker followed for asthma, COPD, chronic respiratory failure O2 2.5 L sleep and exertion. Discussed possible lisinopril>cough. At LOV was increased O2 2L.> 2.5, prednisone taper, doxycycline. Still much cough with white sputum.  06/05/2015-74 year old male former smoker followed for asthma, COPD, chronic respiratory failure, complicated by Parkinson's O2 2.5 L/[employer] sleep and exertion  Follows For: pt states he is here for a follow up and papers that need to be filled out for his nebulizer. pt c/o prod cough greyish in color, SOB with a hard time breathing and wheezing. pt uisg nebulizer with relief. no c/o chest tightness  Feels better in cold air with weather change. Continues oxygen at night and as needed with activity. Only occasionally uses nebulizer or rescue inhaler CXR 09/06/2014 IMPRESSION: Underlying emphysema. Areas of apical and basilar scarring bilaterally. No edema or consolidation. Electronically Signed  By: Bretta Bang III M.D.  On: 09/06/2014 08:56  ROS-see HPI   Negative unless "+" Constitutional:    weight loss, night sweats, fevers, chills, fatigue, lassitude. HEENT:    headaches, difficulty swallowing, tooth/dental problems, sore throat,       sneezing, itching, ear ache, nasal congestion, post nasal drip, snoring CV:    chest pain, orthopnea, PND, swelling in lower extremities, anasarca,                                                      dizziness, palpitations Resp:   + shortness of breath with exertion or at rest.                +productive cough,   non-productive cough, coughing up of blood.              change in  color of mucus.  wheezing.   Skin:    rash or lesions.  GI:  No-   heartburn, indigestion, abdominal pain, nausea,  GU:  MS:   joint pain, stiffness,  Neuro-     nothing unusual Psych:  change in mood or affect.  depression or anxiety.   memory loss.  OBJ- Physical Exam General- Alert, Oriented, Affect-appropriate, Distress- none acute, + obese, room air saturation 92% Skin- rash-none, lesions- none, excoriation- none Lymphadenopathy- none Head- atraumatic            Eyes- Gross vision intact, PERRLA, conjunctivae and secretions clear            Ears- Hearing, canals-normal            Nose- Clear, no-Septal dev, mucus, polyps, erosion, perforation             Throat- Mallampati II , mucosa clear , drainage- none, tonsils- atrophic Neck- flexible , trachea midline, no stridor , thyroid nl, carotid no bruit Chest -  symmetrical excursion , unlabored           Heart/CV- RRR , no murmur , no gallop  , no rub, nl s1 s2                           - JVD- none , edema- none, stasis changes- none, varices- none           Lung- clear/ unlabored, wheeze- none, cough + light rattle in , dullness-none, rub- none           Chest wall-  Abd-  Br/ Gen/ Rectal- Not done, not indicated Extrem- cyanosis- none, clubbing, none, atrophy- none, strength- nl Neuro- + pill- rolling tremor

## 2015-06-05 NOTE — Patient Instructions (Signed)
Flu vax  Order- ONOX on room air      Dx COPD mixed type  Ok to continue current meds

## 2015-06-05 NOTE — Assessment & Plan Note (Signed)
Mild chronic cough and chest rattle without acute exacerbation. His nebulizer and associated medications are available if needed and we discussed management through the winter. Plan-flu vaccine

## 2015-06-05 NOTE — Assessment & Plan Note (Signed)
Chronic hypoxic respiratory failure, oxygen dependent for sleep Plan-overnight oximetry on room air to requalify

## 2015-06-28 ENCOUNTER — Telehealth: Payer: Self-pay | Admitting: Internal Medicine

## 2015-06-28 DIAGNOSIS — J449 Chronic obstructive pulmonary disease, unspecified: Secondary | ICD-10-CM

## 2015-06-28 DIAGNOSIS — J9611 Chronic respiratory failure with hypoxia: Secondary | ICD-10-CM

## 2015-06-28 NOTE — Telephone Encounter (Signed)
Pt is aware of need to continue O2 QHS and aware order to continue O2 has been sent to DME.

## 2015-07-06 ENCOUNTER — Other Ambulatory Visit: Payer: Self-pay | Admitting: Internal Medicine

## 2015-07-06 NOTE — Telephone Encounter (Signed)
benicare 40 mg last refilled by Dr. Maple HudsonYoung on 03/16/15 #15 tabs x 1 refill Take 1/2 tab daily.  Dr. Maple HudsonYoung, Do you want to refill this or does this need to go through PCP? thanks

## 2015-07-06 NOTE — Telephone Encounter (Signed)
We would have just tried it to see if it helped cough. Please ask him to go through hisw PCP as the doctor who manages his BP

## 2015-07-12 ENCOUNTER — Other Ambulatory Visit: Payer: Self-pay | Admitting: Internal Medicine

## 2015-08-01 ENCOUNTER — Telehealth: Payer: Self-pay | Admitting: Internal Medicine

## 2015-08-01 NOTE — Telephone Encounter (Signed)
Spoke with pt's wife, states that pt needs a refill on Benicar.  Per last refill, CY stated that further refills need to come from PCP.  Pt's wife expressed understanding of this.  Nothing further needed.

## 2015-09-14 ENCOUNTER — Ambulatory Visit: Payer: Medicare HMO | Admitting: Internal Medicine

## 2015-10-06 ENCOUNTER — Telehealth: Payer: Self-pay | Admitting: Internal Medicine

## 2015-10-06 MED ORDER — PREDNISONE 20 MG PO TABS: 20.0000 mg | ORAL_TABLET | Freq: Every day | ORAL | Status: DC

## 2015-10-06 NOTE — Telephone Encounter (Signed)
Offer prednisone 20 mg, # 4, 1 daily through the weekend. See if that helps.  If gets worse over weekend, go to ER

## 2015-10-06 NOTE — Telephone Encounter (Signed)
Spoke with the pt  He is c/o increased SOB x 3 days  He wakes up in the night feeling SOB and gets winded just walking from room to room at home  Albuterol nebs help some He denies any f/c/s, sore throat, wheezing, significant cough, CP/tightness  Please advise recs thanks! Allergies  Allergen Reactions  . Hydrocodone     REACTIONS: hallucinations   Current Outpatient Prescriptions on File Prior to Visit  Medication Sig Dispense Refill  . albuterol (PROAIR HFA) 108 (90 BASE) MCG/ACT inhaler 2 puffs every 4-6 hours as needed- rescue 1 Inhaler prn  . albuterol (PROVENTIL) (2.5 MG/3ML) 0.083% nebulizer solution Take 3 mLs (2.5 mg total) by nebulization every 6 (six) hours as needed for wheezing or shortness of breath. DX 496 360 mL 12  . amLODipine (NORVASC) 5 MG tablet Take 5 mg by mouth at bedtime.    [name]. aspirin 81 MG tablet Take 81 mg by mouth daily.    [name]. atorvastatin (LIPITOR) 40 MG tablet Take 1 tablet by mouth daily.    . Carbidopa-Levodopa ER (SINEMET CR) 25-100 MG tablet controlled release Take 1 tablet by mouth 3 (three) times daily.    . diclofenac (VOLTAREN) 75 MG EC tablet Take 75 mg by mouth 2 (two) times daily.    [name]. etodolac (LODINE) 400 MG tablet Take 400 mg by mouth 2 (two) times daily.    . finasteride (PROSCAR) 5 MG tablet Take 1 tablet by mouth daily.    . Fluticasone Furoate-Vilanterol (BREO ELLIPTA) 100-25 MCG/INH AEPB 1 puff then rinse mouth, once daily 1 each prn  . isosorbide dinitrate (ISORDIL) 30 MG tablet Take 30 mg by mouth daily.    [name]. loratadine (CLARITIN) 10 MG tablet Take 10 mg by mouth daily.    . metoprolol succinate (TOPROL-XL) 100 MG 24 hr tablet Take 100 mg by mouth daily.    . Nebulizers (COMPRESSOR/NEBULIZER) MISC Use as directed 1 each 0  . nitroGLYCERIN (NITROSTAT) 0.4 MG SL tablet Place 0.4 mg under the tongue every 5 (five) minutes x 3 doses as needed.    [name]. olmesartan (BENICAR) 40 MG tablet take 1/2 tablet by mouth once daily 15 tablet 0  . Psyllium  (METAMUCIL PO) Take by mouth as needed.    . simvastatin (ZOCOR) 80 MG tablet Take 80 mg by mouth at bedtime.    . sulfaSALAzine (AZULFIDINE) 500 MG tablet Take 500 mg by mouth 2 (two) times daily.    [name]. tiotropium (SPIRIVA) 18 MCG inhalation capsule Place 1 capsule (18 mcg total) into inhaler and inhale daily. 10 capsule 0   No current facility-administered medications on file prior to visit.

## 2015-10-06 NOTE — Telephone Encounter (Signed)
Spoke with pt's spouse and notified of recs  She verbalized understanding  Rx was sent to pharm Nothing further needed

## 2015-10-09 ENCOUNTER — Telehealth: Payer: Self-pay | Admitting: Internal Medicine

## 2015-10-09 NOTE — Telephone Encounter (Signed)
Spoke with pt. He reports increased SOB. Wants to be seen, he has been scheduled to see TP on 10/10/15 at 9:30am. Advised him that if his breathing gets worse he needs to seek medical attention. He verbalized understanding.

## 2015-10-10 ENCOUNTER — Ambulatory Visit: Payer: Medicare HMO | Admitting: Adult Health

## 2015-10-12 ENCOUNTER — Telehealth: Payer: Self-pay | Admitting: Internal Medicine

## 2015-10-12 NOTE — Telephone Encounter (Signed)
Called spoke with spouse. appt scheduled to see Maralyn SagoSarah 10/18/15 at 11. Nothing further needed

## 2015-10-12 NOTE — Telephone Encounter (Signed)
Yes- NP will be good

## 2015-10-12 NOTE — Telephone Encounter (Signed)
Called spoke with spouse. She reports pt was seen in [facility] and was DX with PNA. He was told he needed to see us in 1 week. Dr. Maple HudsonYoung, can pt see NP?

## 2015-10-13 ENCOUNTER — Telehealth: Payer: Self-pay | Admitting: Internal Medicine

## 2015-10-13 DIAGNOSIS — J449 Chronic obstructive pulmonary disease, unspecified: Secondary | ICD-10-CM

## 2015-10-13 NOTE — Telephone Encounter (Signed)
Spoke with pt's wife. She would like an order to be sent to [facility] to be evaluated for a POC. Pt currently uses 2L 24/7. Pt's wife states that he doesn't have any type of oxygen to leave the home with. Order has been placed. Nothing further was needed.

## 2015-10-18 ENCOUNTER — Ambulatory Visit (INDEPENDENT_AMBULATORY_CARE_PROVIDER_SITE_OTHER): Payer: Medicare HMO | Admitting: Acute Care

## 2015-10-18 ENCOUNTER — Encounter: Payer: Self-pay | Admitting: Acute Care

## 2015-10-18 VITALS — BP 144/82 | HR 58 | Temp 97.7°F | Ht 72.0 in | Wt 246.2 lb

## 2015-10-18 DIAGNOSIS — J9611 Chronic respiratory failure with hypoxia: Secondary | ICD-10-CM

## 2015-10-18 NOTE — Assessment & Plan Note (Addendum)
Recent Hospitalization for Acute on Chronic Respiratory Failure with hypoxia 2/2 RLL Pneumonia Plan: We are glad you are feeling better Continue taking your Spiriva Continue using your  Pro Air inhaler as a rescue medication. We will walk you today to see if you qualify for daytime oxygen. Return in 2 weeks for follow up appointment and CXR to make sure the pneumonia has resolved. Please contact office for sooner follow up if symptoms do not improve or worsen or seek emergency care

## 2015-10-18 NOTE — Patient Instructions (Addendum)
It was nice to meet you today. I am glad you are feeling better. Continue taking your Spiriva Continue using your  Pro Air inhaler as a rescue medication. We will walk you today to see if you qualify for daytime oxygen. Return in 2 weeks for follow up appointment and CXR to make sure the pneumonia has resolved. Please contact office for sooner follow up if symptoms do not improve or worsen or seek emergency care

## 2015-10-18 NOTE — Progress Notes (Signed)
Subjective:    Patient ID: Joseph Castaneda, male    DOB: Aug 04, 1940, 75 y.o.   MRN: [id number]  HPI        75 year old former smoker followed for asthma, COPD, oxygen dependent wears 2.5 L  at night for sleep. Followed by Joseph Castaneda.   Significant Events/ Procedures: Recent Hospitalization: [facility]  Admit date: 10/09/2015 Discharge date: 10/11/2015  Hospital LOS: 2 days  Hospital Course: Joseph Castaneda is a 75 y.o. White or Caucasian male with PMHx of ischemic cardiomyopathy, chronic respiratory failure on 2.5 L of oxygen at home continuously, hypertension, questionable history of Parkinson's disease who presented to [facility] with cough (productive but does not know color of phlegm) for 3-4 days and shortness of breath for 3-4 days. He also noticed trace lower extremity edema as he has not been taking his Lasix since he started feeling unwell. He stated that his daughter had been sick with flu. No recent travel.   In the emergency department, patient had routine laboratory evaluation which showed a WBC of 11 K.Chest x-ray showed right lower lobe infiltrate. BNP was 1956. Initial set of troponins were negative. ECG showed accelerated junctional rhythm. Patient was saturating well on baseline oxygen requirements. He was admitted to telemetry for further workup and treatment the following:  1) acute on chronic respiratory failure with hypoxia secondary to Right lower lobe pneumonia, possibly secondary to gram positive bacteria-continued on IV abx, nebs, supplemental oxygen, steroids. Blood cultures were not collected in ED. On day of discharge, patient felt well. He was on baseline oxygen requirement of 2.5 liters.   2) Ischemic cardiomyopathy-continued on optimal medical management with ASA, statin.   3) Parkinson's disease- home PT  4) Essential hypertension, benign-stable on home amlodipine, metoprolol, olmesartan  5) acute  exacerbation of Chronic obstructive pulmonary disease-continued on abx, nebs, supplemental oxygen, steroids.  On day of discharge, patient was hemodynamically stable. He will f/u with PCP in 1 week and with pulmonology in 1-2 weeks. He will complete course of abx and steroid taper as outpatient.                                                                                                                                             Start         End doxycycline hyclate (VIBRA-TABS) 100 mg tablet  Take one tablet (100 mg total) by mouth 2 (two) times daily. 14 tablet  0 10/11/2015 10/18/2015   cefdinir (OMNICEF) 300 mg capsule  Take one capsule (300 mg total) by mouth 2 (two) times daily. 14 capsule  0 10/11/2015 10/18/2015   methylPREDNISolone (MEDROL DOSEPACK) 4 mg tablet  Take one tablet (4 mg total) by mouth daily. follow package directions 21 tablet  0 10/11/2015 10/10/2016   10/13/2015:PCP Follow UP Visit: WBC 12.0   10/18/15:Hospital Follow Up: Presents  for follow up appointment after  hospitalization in [place] from 10/09/2015 through 10/11/2015 where he was hospitalized for acute on chronic respiratory failure with hypoxia secondary to new mild right infrahilar/ right lower lobe infiltrate. He is a poor historian. Documentation of hospitalization obtained through Care Everywhere. Pt. States that he is feeling better.He is using his nebulizer treatments up to 4 times daily. Denies fever, chest pain,purulent sputum,hemoptysis, orthopnea, leg or calf pain. States he still gets tired easily.He did not wear oxygen to the office today, and upon checking his SaO2 was 88% on room air. He was placed on oxygen for the duration of the office visit. Pt. States he has not been taking his Omnicef as prescribed. He states he did not know he was to take it twice daily. He also states that he has missed some doses.We discussed the importance of taking the antibiotic as prescribed after a bacterial  infection. WBC on follow up visit to PCP was 12 with continued ABX treatment.He has a return appointment with his PCP on 10/27/2015 for additional follow up.We did walk Joseph Castaneda today to see if we could qualify him for daytime oxygen and a portable tank, but he did not drop his SaO2's today below 96% while walking.   Current outpatient prescriptions:  .  albuterol (PROAIR HFA) 108 (90 BASE) MCG/ACT inhaler, 2 puffs every 4-6 hours as needed- rescue, Disp: 1 Inhaler, Rfl: prn .  albuterol (PROVENTIL) (2.5 MG/3ML) 0.083% nebulizer solution, Take 3 mLs (2.5 mg total) by nebulization every 6 (six) hours as needed for wheezing or shortness of breath. DX 496, Disp: 360 mL, Rfl: 12 .  amLODipine (NORVASC) 5 MG tablet, Take 5 mg by mouth at bedtime., Disp: , Rfl:  .  aspirin 81 MG tablet, Take 81 mg by mouth daily., Disp: , Rfl:  .  atorvastatin (LIPITOR) 40 MG tablet, Take 1 tablet by mouth daily., Disp: , Rfl:  .  Carbidopa-Levodopa ER (SINEMET CR) 25-100 MG tablet controlled release, Take 1 tablet by mouth 3 (three) times daily., Disp: , Rfl:  .  etodolac (LODINE) 400 MG tablet, Take 400 mg by mouth 2 (two) times daily., Disp: , Rfl:  .  finasteride (PROSCAR) 5 MG tablet, Take 1 tablet by mouth daily., Disp: , Rfl:  .  Fluticasone Furoate-Vilanterol (BREO ELLIPTA) 100-25 MCG/INH AEPB, 1 puff then rinse mouth, once daily, Disp: 1 each, Rfl: prn .  isosorbide dinitrate (ISORDIL) 30 MG tablet, Take 30 mg by mouth daily., Disp: , Rfl:  .  loratadine (CLARITIN) 10 MG tablet, Take 10 mg by mouth daily., Disp: , Rfl:  .  metoprolol succinate (TOPROL-XL) 100 MG 24 hr tablet, Take 100 mg by mouth daily., Disp: , Rfl:  .  Nebulizers (COMPRESSOR/NEBULIZER) MISC, Use as directed, Disp: 1 each, Rfl: 0 .  olmesartan (BENICAR) 40 MG tablet, take 1/2 tablet by mouth once daily, Disp: 15 tablet, Rfl: 0 .  predniSONE (DELTASONE) 20 MG tablet, Take 1 tablet (20 mg total) by mouth daily with breakfast., Disp: 4 tablet,  Rfl: 0 .  Psyllium (METAMUCIL PO), Take by mouth as needed., Disp: , Rfl:  .  simvastatin (ZOCOR) 80 MG tablet, Take 80 mg by mouth at bedtime., Disp: , Rfl:  .  sulfaSALAzine (AZULFIDINE) 500 MG tablet, Take 500 mg by mouth 2 (two) times daily., Disp: , Rfl:  .  nitroGLYCERIN (NITROSTAT) 0.4 MG SL tablet, Place 0.4 mg under the tongue every 5 (five) minutes x 3 doses as needed., Disp: ,  Rfl:  .  tiotropium (SPIRIVA) 18 MCG inhalation capsule, Place 1 capsule (18 mcg total) into inhaler and inhale daily., Disp: 10 capsule, Rfl: 0   Past Medical History  Diagnosis Date  . Asthma   . Emphysema of lung (HCC)   . Hyperlipidemia   . Hypertension     Allergies  Allergen Reactions  . Hydrocodone     REACTIONS: hallucinations    Review of Systems Constitutional:   No  weight loss, night sweats,  Fevers, chills, +fatigue, or  lassitude.  HEENT:   No headaches,  Difficulty swallowing,  Tooth/dental problems, or  Sore throat,                No sneezing, itching, ear ache, nasal congestion, post nasal drip,   CV:  No chest pain,  Orthopnea, PND, swelling in lower extremities, anasarca, dizziness, palpitations, syncope.   GI  No heartburn, indigestion, abdominal pain, nausea, vomiting, diarrhea, change in bowel habits, loss of appetite, bloody stools.   Resp: + shortness of breath with exertion not at rest.  No excess mucus, no productive cough,  No non-productive cough,  No coughing up of blood.  No change in color of mucus.  No wheezing.  No chest wall deformity  Skin: no rash or lesions.  GU: no dysuria, change in color of urine, no urgency or frequency.  No flank pain, no hematuria   MS:  No joint pain or swelling.  No decreased range of motion.  No back pain.  Psych:  No change in mood or affect. No depression or anxiety.  No memory loss.        Objective:   Physical Exam  BP 144/82 mmHg  Pulse 58  Temp(Src) 97.7 F (36.5 C) (Oral)  Ht 6' (1.829 m)  Wt 246 lb 3.2 oz  (111.676 kg)  BMI 33.38 kg/m2  SpO2 93%  Physical Exam:  General- No distress,  A&Ox3, clearly fatigued ENT: No sinus tenderness, TM clear, pale nasal mucosa, no oral exudate,no post nasal drip, no LAN Cardiac: S1, S2, regular rate and rhythm, no murmur Chest: No wheeze/ rales/ dullness; no accessory muscle use, no nasal flaring, no sternal retractions Abd.: Soft Non-tender Ext: No clubbing cyanosis,  Trace edema lower extremities Neuro:  normal strength Skin: No rashes, warm and dry Psych: normal mood and behavior  Bevelyn NgoSarah F. Groce, AGACNP-BC [facility]monary/[facility] Medicine Pager # [phone] 10/18/2015    Assessment & Plan:

## 2015-11-03 ENCOUNTER — Ambulatory Visit (INDEPENDENT_AMBULATORY_CARE_PROVIDER_SITE_OTHER): Payer: Medicare HMO | Admitting: Internal Medicine

## 2015-11-03 ENCOUNTER — Ambulatory Visit (INDEPENDENT_AMBULATORY_CARE_PROVIDER_SITE_OTHER)
Admission: RE | Admit: 2015-11-03 | Discharge: 2015-11-03 | Disposition: A | Discharge status: Home / Self Care | Payer: Medicare HMO | Source: Ambulatory Visit | Attending: Internal Medicine | Admitting: Internal Medicine

## 2015-11-03 ENCOUNTER — Encounter: Payer: Self-pay | Admitting: Internal Medicine

## 2015-11-03 VITALS — BP 132/82 | HR 66 | Ht 72.0 in | Wt 239.8 lb

## 2015-11-03 DIAGNOSIS — J438 Other emphysema: Secondary | ICD-10-CM | POA: Diagnosis not present

## 2015-11-03 DIAGNOSIS — J449 Chronic obstructive pulmonary disease, unspecified: Secondary | ICD-10-CM

## 2015-11-03 DIAGNOSIS — Z23 Encounter for immunization: Secondary | ICD-10-CM | POA: Diagnosis not present

## 2015-11-03 DIAGNOSIS — J9611 Chronic respiratory failure with hypoxia: Secondary | ICD-10-CM | POA: Diagnosis not present

## 2015-11-03 NOTE — Patient Instructions (Signed)
Prevnar 13 pneumonia vaccine      This is a one-time injection- you won't need it again  We can continue current meds  Please call as needed

## 2015-11-03 NOTE — Progress Notes (Signed)
89 [name] former smoker followed for asthma, COPD  LOV- 09/11/09    Wife here He describes persistent cough, improved with Spiriva but productive of yellow sputum. They got rid of their dog and moved to a new home to get away from mold and mildew. Overall he had done much better until this episode. Taking occasional loratadine. Comes today to refill Spiriva. We discussed evaluation of his status. His wife is concerned and asks that we update his PFT. He denies fever, blood, chest pain or palpitation, swelling or nodes. PFT: 05/04/2009-moderate obstructive airways disease with insignificant response to bronchodilator, air trapping, diffusion mildly reduced. FEV1 2.26/73%, FEV1/FVC 0.50, DLCO 71%.  01/14/2012 Post Hospital  [facility] . No records available.  Finished all the abx and steroid taper. Feeling better, but still has cough. Mainly dry.  Of note on ACE inhibitor.  No fever or chest pain .   02/25/12- 75 [name] former smoker followed for asthma, COPD States about the same; slight wheezing and cough, denies any SOB, chest tightness, or chest pain. [facility]. Occasional productive cough, never blood. Uses Alka-Seltzer Plus occasionally for chest congestion. Much dry cough-we discussed lisinopril. Spiriva helps. COPD assessment test (CAT) 24/40  10/01/12- 75 [name] former smoker followed for asthma, COPD FOLLOWS FOR: has not had Sprivia in about 3 days due to money; can tell his breathing has worsened since. Increased SOB. He got through the winter okay. Now out of Spiriva which has helped. Price is a problem. We discussed alternate day Spiriva or switch to Atrovent inhaler. CXR 01/21/12 IMPRESSION:  COPD. Stable bibasilar linear scarring. No definite active  process.  Original Report Authenticated By: Juline PatchPAUL D. BARRY, M.D.  04/05/13- [age] [name] former smoker followed for asthma, COPD FOLLOWS FOR: when active-"can't breathe"-not able to get enough  air. If relaxed its okay. He considers this baseline. Wife says he gets short of breath and diaphoretic going out the trash can. He can't afford Spiriva on time each month so uses it every other day. Does use nebulizer  10/22/2013 Follow up -Mod COPD w/ asthma component  Returns for follow up  Recent hospitalization for chest pain and HTN. Records reviewed from [facility] with neg card enzymes. CXR w/ no acute findings.  He says his  breathing is doing well since discharge but does report some occ cough with gray-to-yellow mucus from time to time. Main issue is gets winded with walking, wears out easily. Wt continues to climb , now around ~250lbs.  On O2 At bedtime .  Continue on Spiriva. daily  On ACE -has some cough but says not that bad.  Previous PFT in 2313 showed airflow obstruction w/ positive BD response.   09/05/14- [age] [name] former smoker followed for asthma, COPD FOLLOWS FOR: Pt states he is having trouble with breathing-increased SOB and wheezing-worse activity x 10 days. We had called cefdinir which he refilled. Sputum originally green but has now cleared. Still feels congested in head and chest without fever. Uses nebulizer yesterday. Daily Spiriva but infrequent rescue inhaler.  11/07/14- 89 [name] former smoker followed for asthma, COPD FOLLOWS FOR: Review PFT with patient; pt's wife states patients breathing is not so good and he has now started wearing his O2. Wife here Easier dyspnea on exertion with no acute or specific event. Not using Breo and irregular but using Spiriva. He says his nebulizer machine takes too much time to bother with. We discussed the purpose of these medicines.  PFT 11/07/2014-severe obstructive airways disease with significant response to bronchodilator. FEV1 1.86/54% predicted, DLCO 51% CXR 09/06/14 IMPRESSION: Underlying emphysema. Areas of apical and basilar scarring bilaterally. No edema or consolidation. Electronically Signed  By: Bretta Bang III  M.D.  On: 09/06/2014 08:56  01/02/2015 Acute OV  Complains of increased SOB, non-productive cough, and wheezing x 7 days.  Denies fever, chills, hemoptysis, n/v, and edema.  Remains on Spiriva and BREO . Says taking both .  BREO costs him more.  Pt states only using O2 at 2L prn.   Remains on ACE inhibitor.  03/16/15- 75 [name] former smoker followed for asthma, COPD, chronic respiratory failure O2 2.5 L sleep and exertion. Discussed possible lisinopril>cough. At LOV was increased O2 2L.> 2.5, prednisone taper, doxycycline. Still much cough with white sputum.  06/05/2015-75 year old male former smoker followed for asthma, COPD, chronic respiratory failure, complicated by Parkinson's, ischemic cardiomyopathy, HBP O2 2.5 L/[employer] sleep and exertion  Follows For: pt states he is here for a follow up and papers that need to be filled out for his nebulizer. pt c/o prod cough greyish in color, SOB with a hard time breathing and wheezing. pt uisg nebulizer with relief. no c/o chest tightness  Feels better in cold air with weather change. Continues oxygen at night and as needed with activity. Only occasionally uses nebulizer or rescue inhaler CXR 09/06/2014 IMPRESSION: Underlying emphysema. Areas of apical and basilar scarring bilaterally. No edema or consolidation. Electronically Signed  By: Bretta Bang III M.D.  On: 09/06/2014 08:56  11/03/2015-75 year old male former smoker followed for asthma, COPD, chronic respiratory failure, complicated by Parkinson's O2 2.5 L/[employer] sleep and exertion, CAD/CABG Last seen here by NP on 10/18/2015- following up ER visit for right lower lobe pneumonia, BNP 1956 with accelerated junctional rhythm, acute on chronic respiratory failure, acute exacerbation COPD treated with doxycycline, steroids, cefdinir.  Returning now for follow-up chest x-ray to confirm resolution of pneumonia. Follow for: SOB worse on exertion sx's about the same as last visit. Still  using nebulizer as prn. Occ wheezing inhaler really helps with breathing. Denies any chest pain/tightness.Feeling weak and fatuigued same as last visit.  He feels back to baseline with at least some cough productive of clear sputum most days. No blood or chest pain. He doesn't think he has had Prevnar 13 pneumonia vaccine so we are giving it today. CXR 11/03/2015 (today)-reviewed, no pneumonia IMPRESSION: 1. Hyperinflation and bibasilar linear scarring or atelectasis  without acute or superimposed abnormality. Electronically Signed  By: Corlis Leak M.D.  On: 11/03/2015 10:45   ROS-see HPI   Negative unless "+" Constitutional:    weight loss, night sweats, fevers, chills, fatigue, lassitude. HEENT:    headaches, difficulty swallowing, tooth/dental problems, sore throat,       sneezing, itching, ear ache, nasal congestion, post nasal drip, snoring CV:    chest pain, orthopnea, PND, swelling in lower extremities, anasarca,                                                      dizziness, palpitations Resp:   + shortness of breath with exertion or at rest.                +productive cough,   non-productive cough, coughing up of blood.  change in color of mucus.  wheezing.   Skin:    rash or lesions.  GI:  No-   heartburn, indigestion, abdominal pain, nausea,  GU:  MS:   joint pain, stiffness,  Neuro-     nothing unusual Psych:  change in mood or affect.  depression or anxiety.   memory loss.  OBJ- Physical Exam General- Alert, Oriented, Affect-appropriate, Distress- none acute, + obese, room air saturation 92% Skin- rash-none, lesions- none, excoriation- none Lymphadenopathy- none Head- atraumatic            Eyes- Gross vision intact, PERRLA, conjunctivae and secretions clear            Ears- Hearing, canals-normal            Nose- Clear, no-Septal dev, mucus, polyps, erosion, perforation             Throat- Mallampati II , mucosa clear , drainage- none, tonsils-  atrophic Neck- flexible , trachea midline, no stridor , thyroid nl, carotid no bruit Chest - symmetrical excursion , unlabored           Heart/CV- RRR , no murmur , no gallop  , no rub, nl s1 s2                           - JVD- none , edema- none, stasis changes- none, varices- none           Lung- + few rhonchi right base, wheeze- none, cough -none , dullness-none, rub- none           Chest wall-  Abd-  Br/ Gen/ Rectal- Not done, not indicated Extrem- cyanosis- none, clubbing, none, atrophy- none, strength- nl Neuro- + pill- rolling tremor

## 2015-11-03 NOTE — Assessment & Plan Note (Signed)
Recovering back to baseline after recent bacterial pneumonia. Breath sounds suggest a little residual bronchitis in the right base which should clear. Medications okay.

## 2015-11-03 NOTE — Assessment & Plan Note (Signed)
Remains on oxygen for sleep and exertion

## 2015-12-04 ENCOUNTER — Ambulatory Visit: Payer: Medicare HMO | Admitting: Internal Medicine

## 2016-04-15 ENCOUNTER — Ambulatory Visit (INDEPENDENT_AMBULATORY_CARE_PROVIDER_SITE_OTHER)
Admission: RE | Admit: 2016-04-15 | Discharge: 2016-04-15 | Disposition: A | Discharge status: Home / Self Care | Payer: Medicare HMO | Source: Ambulatory Visit | Attending: Acute Care | Admitting: Acute Care

## 2016-04-15 ENCOUNTER — Ambulatory Visit (INDEPENDENT_AMBULATORY_CARE_PROVIDER_SITE_OTHER): Payer: Medicare HMO | Admitting: Acute Care

## 2016-04-15 ENCOUNTER — Encounter: Payer: Self-pay | Admitting: Acute Care

## 2016-04-15 ENCOUNTER — Telehealth: Payer: Self-pay | Admitting: Acute Care

## 2016-04-15 VITALS — BP 172/82 | HR 104 | Temp 97.6°F | Ht 72.0 in | Wt 232.8 lb

## 2016-04-15 DIAGNOSIS — J449 Chronic obstructive pulmonary disease, unspecified: Secondary | ICD-10-CM

## 2016-04-15 DIAGNOSIS — J9611 Chronic respiratory failure with hypoxia: Secondary | ICD-10-CM

## 2016-04-15 MED ORDER — FLUTICASONE FUROATE-VILANTEROL 100-25 MCG/INH IN AEPB: INHALATION_SPRAY | RESPIRATORY_TRACT | 5 refills | Status: DC

## 2016-04-15 MED ORDER — PREDNISONE 10 MG PO TABS: ORAL_TABLET | ORAL | 0 refills | Status: DC

## 2016-04-15 MED ORDER — DOXYCYCLINE HYCLATE 100 MG PO TABS: 100.0000 mg | ORAL_TABLET | Freq: Two times a day (BID) | ORAL | 0 refills | Status: DC

## 2016-04-15 NOTE — Assessment & Plan Note (Addendum)
Exacerbation of COPD Plan: We will do a CXR today. We will call you with results Remember to take your Breo every day without fail. This is your maintenance medication. Continue using your Spiriva Daily as you have been doing. We will send in a prescription for refill. Continue using your Albuterol inhaler/ neb treatments as needed for shortness of breath ir wheezing. Prednisone taper; 10 mg tablets: 4 tabs x 2 days, 3 tabs x 2 days, 2 tabs x 2 days 1 tab x 2 days then stop. Doxycycline 100 mg twice daily x 1 week. Mucinex DM Twice daily  As needed  Cough/congestion  Oyxgen 2.5L with activity and At bedtime   Follow up Dr. Maple HudsonYoung 10/26 as is scheduled.   Please contact office for sooner follow up if symptoms do not improve or worsen or seek emergency care

## 2016-04-15 NOTE — Patient Instructions (Addendum)
It is nice to see you tody. We will do a CXR today. We will call you with results Remember to take your Breo every day without fail. This is your maintenance medication. We will send in a prescription for refill. Continue using your Albuterol inhaler/ neb treatments as needed for shortness of breath ir wheezing. Prednisone taper; 10 mg tablets: 4 tabs x 2 days, 3 tabs x 2 days, 2 tabs x 2 days 1 tab x 2 days then stop. Doxycycline 100 mg twice daily x 1 week. Mucinex DM Twice daily  As needed  Cough/congestion  Oyxgen 2.5L with activity and At bedtime   Follow up Dr. Maple HudsonYoung 10/26 as is scheduled.   Please contact office for sooner follow up if symptoms do not improve or worsen or seek emergency care

## 2016-04-15 NOTE — Assessment & Plan Note (Signed)
Oxygen 2.5 L with activity and at bedtime  Follow  up with Dr. Maple HudsonYoung as scheduled 05/09/2016

## 2016-04-15 NOTE — Telephone Encounter (Signed)
Called and spoke with [facility] and they are aware that pt has an appt with CY on 10/26 and note has been added so this will be done at that OV.  Nothing further is needed.

## 2016-04-15 NOTE — Progress Notes (Signed)
History of Present Illness Joseph SpatesRobert M Castaneda is a 75 y.o. male former smoker with asthma and COPD , who wears  oxygen at 2.5 L. He is  followed by Dr. Maple HudsonYoung.   04/15/2016 Acute OV for increasing Dyspnea.  Pt. Presents to the office today for new onset productive cough with congestion. He states that this started about 1 week ago. He is unsure if he has fever, but states he has had some " hot flashes". He states he has had some chest pain on his left side. It is non-radiating.He has been coughing up gray colored secretions, which is not baseline for him.Joseph Castaneda. He states that he has not been taking his Breo every day. He has not been using his albuterol inhaler or his nebulizer treatments. He is currently not wearing his oxygen. He left it in the car because the shoulder strap is broken. We reviewed the importance of wearing his oxygen with activity as his oxygen saturations drop. We also reviewed the importance of using his Breo. He denies  orthopnea or hemoptysis, leg pain or swelling.  Tests  CXR ordered this visit>> pending   Past medical hx Past Medical History:  Diagnosis Date  . Asthma   . Emphysema of lung (HCC)   . Hyperlipidemia   . Hypertension      Past surgical hx, Family hx, Social hx all reviewed.  Current Outpatient Prescriptions on File Prior to Visit  Medication Sig  . albuterol (PROAIR HFA) 108 (90 BASE) MCG/ACT inhaler 2 puffs every 4-6 hours as needed- rescue  . albuterol (PROVENTIL) (2.5 MG/3ML) 0.083% nebulizer solution Take 3 mLs (2.5 mg total) by nebulization every 6 (six) hours as needed for wheezing or shortness of breath. DX 496  . amLODipine (NORVASC) 5 MG tablet Take 5 mg by mouth at bedtime.  Joseph Castaneda. aspirin 81 MG tablet Take 81 mg by mouth daily.  Joseph Castaneda. atorvastatin (LIPITOR) 40 MG tablet Take 1 tablet by mouth daily.  . Carbidopa-Levodopa ER (SINEMET CR) 25-100 MG tablet controlled release Take 1 tablet by mouth 3 (three) times daily.  Joseph Castaneda. etodolac (LODINE) 400 MG  tablet Take 400 mg by mouth 2 (two) times daily.  . finasteride (PROSCAR) 5 MG tablet Take 1 tablet by mouth daily.  . isosorbide dinitrate (ISORDIL) 30 MG tablet Take 30 mg by mouth daily.  Joseph Castaneda. loratadine (CLARITIN) 10 MG tablet Take 10 mg by mouth daily.  . metoprolol succinate (TOPROL-XL) 100 MG 24 hr tablet Take 100 mg by mouth daily.  . Nebulizers (COMPRESSOR/NEBULIZER) MISC Use as directed  . olmesartan (BENICAR) 40 MG tablet take 1/2 tablet by mouth once daily  . predniSONE (DELTASONE) 20 MG tablet Take 1 tablet (20 mg total) by mouth daily with breakfast.  . Psyllium (METAMUCIL PO) Take by mouth as needed.  . simvastatin (ZOCOR) 80 MG tablet Take 80 mg by mouth at bedtime.  . sulfaSALAzine (AZULFIDINE) 500 MG tablet Take 500 mg by mouth 2 (two) times daily.  . vitamin B-12 (CYANOCOBALAMIN) 100 MCG tablet Take 1 tablet by mouth daily.  . nitroGLYCERIN (NITROSTAT) 0.4 MG SL tablet Place 0.4 mg under the tongue every 5 (five) minutes x 3 doses as needed.  . tiotropium (SPIRIVA) 18 MCG inhalation capsule Place 1 capsule (18 mcg total) into inhaler and inhale daily.   No current facility-administered medications on file prior to visit.      Allergies  Allergen Reactions  . Hydrocodone     REACTIONS: hallucinations    Review Of  Systems:  Constitutional:   No  weight loss, night sweats,  Fevers, chills, fatigue, or  lassitude.  HEENT:   No headaches,  Difficulty swallowing,  Tooth/dental problems, or  Sore throat,                No sneezing, itching, ear ache, nasal congestion, post nasal drip,   CV:  + chest pain,  Orthopnea, PND, swelling in lower extremities, anasarca, dizziness, palpitations, syncope.   GI  No heartburn, indigestion, abdominal pain, nausea, vomiting, diarrhea, change in bowel habits, loss of appetite, bloody stools.   Resp: + shortness of breath with exertion less  at rest.  + excess mucus, + productive cough,  No non-productive cough,  No coughing up of blood.   + change in color of mucus.  + wheezing.  No chest wall deformity  Skin: no rash or lesions.  GU: no dysuria, change in color of urine, no urgency or frequency.  No flank pain, no hematuria   MS:  No joint pain or swelling.  No decreased range of motion.  No back pain.  Psych:  No change in mood or affect. No depression or anxiety.  No memory loss.   Vital Signs BP (!) 172/82 (BP Location: Right Arm, Cuff Size: Normal)   Pulse (!) 104   Temp 97.6 F (36.4 C) (Oral)   Ht 6' (1.829 m)   Wt 232 lb 12.8 oz (105.6 kg)   SpO2 91%   BMI 31.57 kg/m    Physical Exam:  General- No distress,  A&Ox3, obese  ENT: No sinus tenderness, TM clear, pale nasal mucosa, no oral exudate,no post nasal drip, no LAN Cardiac: S1, S2, regular rate and rhythm, no murmur Chest: + Exp wheeze/ no rales/ dullness; no accessory muscle use, no nasal flaring, no sternal retractions Abd.: Soft , obese Non-tender Ext: No clubbing cyanosis, trace  edema Neuro:  normal strength Skin: No rashes, warm and dry Psych: normal mood and behavior   Assessment/Plan  COPD mixed type Exacerbation of COPD Plan: We will do a CXR today. We will call you with results Remember to take your Breo every day without fail. This is your maintenance medication. Continue using your Spiriva Daily as you have been doing. We will send in a prescription for refill. Continue using your Albuterol inhaler/ neb treatments as needed for shortness of breath ir wheezing. Prednisone taper; 10 mg tablets: 4 tabs x 2 days, 3 tabs x 2 days, 2 tabs x 2 days 1 tab x 2 days then stop. Doxycycline 100 mg twice daily x 1 week. Mucinex DM Twice daily  As needed  Cough/congestion  Oyxgen 2.5L with activity and At bedtime   Follow up Dr. Maple Hudson 10/26 as is scheduled.   Please contact office for sooner follow up if symptoms do not improve or worsen or seek emergency care     Chronic respiratory failure (HCC) Oxygen 2.5 L with activity and at  bedtime  Follow  up with Dr. Maple Hudson as scheduled 05/09/2016    Joseph Ngo, NP 04/15/2016  1:23 PM

## 2016-04-17 NOTE — Progress Notes (Signed)
Called and spoke to pt. Informed him of the results and recs per SG. Pt verbalized understanding and denied any further questions or concerns at this time.

## 2016-04-29 ENCOUNTER — Telehealth: Payer: Self-pay | Admitting: Acute Care

## 2016-04-29 NOTE — Telephone Encounter (Signed)
Called and spoke with pts wife and she was asking if they needed to refill the doxycycline that was given to the pt on his 10/02 appt with SG.  I advised her that this was given as a 1 time dose and that there were no refills given.  pts wife voiced her understanding and nothing further is needed.

## 2016-05-09 ENCOUNTER — Ambulatory Visit: Payer: Medicare HMO | Admitting: Internal Medicine

## 2016-06-11 ENCOUNTER — Telehealth: Payer: Self-pay | Admitting: Internal Medicine

## 2016-06-11 MED ORDER — ALBUTEROL SULFATE (2.5 MG/3ML) 0.083% IN NEBU: 2.5000 mg | INHALATION_SOLUTION | Freq: Four times a day (QID) | RESPIRATORY_TRACT | 6 refills | Status: AC | PRN

## 2016-06-11 NOTE — Telephone Encounter (Signed)
Rx sent to preferred pharmacy. Pt aware & voiced understanding. Nothing further needed.  

## 2016-07-18 ENCOUNTER — Telehealth: Payer: Self-pay | Admitting: Internal Medicine

## 2016-07-18 NOTE — Telephone Encounter (Signed)
Pt wife states pt has been wearing 02 more frequently d/t worsening SOB- states he cannot go without 02.  Pt's most recent 02 rx was for qhs and with exertion, not continuous.  Pt scheduled with CY tomorrow at 10:00 to be evaluated.    Nothing further needed.

## 2016-07-19 ENCOUNTER — Telehealth: Payer: Self-pay | Admitting: Internal Medicine

## 2016-07-19 ENCOUNTER — Encounter: Payer: Self-pay | Admitting: Internal Medicine

## 2016-07-19 ENCOUNTER — Ambulatory Visit (INDEPENDENT_AMBULATORY_CARE_PROVIDER_SITE_OTHER): Payer: Medicare HMO | Admitting: Internal Medicine

## 2016-07-19 VITALS — BP 134/84 | HR 64 | Temp 97.2°F | Ht 72.0 in | Wt 241.0 lb

## 2016-07-19 DIAGNOSIS — J9611 Chronic respiratory failure with hypoxia: Secondary | ICD-10-CM

## 2016-07-19 DIAGNOSIS — J449 Chronic obstructive pulmonary disease, unspecified: Secondary | ICD-10-CM | POA: Diagnosis not present

## 2016-07-19 NOTE — Progress Notes (Signed)
HPI [name] former smoker followed for asthma, COPD PFT 11/07/2014-severe obstructive airways disease with significant response to bronchodilator. FEV1 1.86/54% predicted, DLCO 51% Qualifying O2 saturation 07/19/2016-83% room air at rest, 92% on O2 2 L with ambulation ------------------------------------------------------------  11/03/2015-76 year old male former smoker followed for asthma, COPD, chronic respiratory failure, complicated by Parkinson's,  O2 2.5 L/[facility] sleep and exertion, CAD/CABG Last seen here by NP on 10/18/2015- following up ER visit for right lower lobe pneumonia, BNP 1956 with accelerated junctional rhythm, acute on chronic respiratory failure, acute exacerbation COPD treated with doxycycline, steroids, cefdinir.  Returning now for follow-up chest x-ray to confirm resolution of pneumonia. Follow for: SOB worse on exertion sx's about the same as last visit. Still using nebulizer as prn. Occ wheezing inhaler really helps with breathing. Denies any chest pain/tightness.Feeling weak and fatuigued same as last visit.  He feels back to baseline with at least some cough productive of clear sputum most days. No blood or chest pain. He doesn't think he has had Prevnar 13 pneumonia vaccine so we are giving it today. CXR 11/03/2015 (today)-reviewed, no pneumonia IMPRESSION: 1. Hyperinflation and bibasilar linear scarring or atelectasis  without acute or superimposed abnormality. Electronically Signed  By: Corlis Leak Hassell M.D.  On: 11/03/2015 10:45  12/01/2016-[age] year old male former smoker followed for asthma, COPD, chronic respiratory failure, complicated by Parkinson's, CAD/CABG O2 2.5 L/[facility]-sleep and exertion Pt is requesting a poc, as he does not currently have portable O2. pt states he has been unable to leave his house due to not having a poc. pt c/o prod cough with grey mucus, wheezing & chest tightness, but states this is his baseline. pt on 2.5L O2 Today on arrival on room  air saturation 83%. He maintained at 92% ambulating on 2 L. wife indicates he is irregular about medication use. Nebulizer helps. Sputum is white. Daily productive cough with no blood or chest pain. Mentions needing nitroglycerin refill to be picked up. CXR 04-2016 IMPRESSION: COPD without acute abnormality.  ROS-see HPI   Negative unless "+" Constitutional:    weight loss, night sweats, fevers, chills, fatigue, lassitude. HEENT:    headaches, difficulty swallowing, tooth/dental problems, sore throat,       sneezing, itching, ear ache, nasal congestion, post nasal drip, snoring CV:    chest pain, orthopnea, PND, swelling in lower extremities, anasarca,                                                      dizziness, palpitations Resp:   + shortness of breath with exertion or at rest.                +productive cough,   non-productive cough, coughing up of blood.              change in color of mucus.  wheezing.   Skin:    rash or lesions.  GI:  No-   heartburn, indigestion, abdominal pain, nausea,  GU:  MS:   joint pain, stiffness,  Neuro-     nothing unusual Psych:  change in mood or affect.  depression or anxiety.   memory loss.  OBJ- Physical Exam General- Alert, Oriented, Affect-appropriate, Distress- none acute, + obese,  Our tank-O2 2L Skin- rash-none, lesions- none, excoriation- none Lymphadenopathy- none Head- atraumatic  Eyes- Gross vision intact, PERRLA, conjunctivae and secretions clear            Ears- Hearing, canals-normal            Nose- Clear, no-Septal dev, mucus, polyps, erosion, perforation             Throat- Mallampati II , mucosa clear , drainage- none, tonsils- atrophic Neck- flexible , trachea midline, no stridor , thyroid nl, carotid no bruit Chest - symmetrical excursion , unlabored           Heart/CV- RRR , no murmur , no gallop  , no rub, nl s1 s2                           - JVD- none , edema- none, stasis changes- none, varices- none            Lung- + few rhonchi right upper back, wheeze- none, cough + loose rattle , dullness-none, rub- none           Chest wall-  Abd-  Br/ Gen/ Rectal- Not done, not indicated Extrem- cyanosis- none, clubbing, none, atrophy- none, strength- nl Neuro- + pill- rolling tremor R hand

## 2016-07-19 NOTE — Patient Instructions (Signed)
Order- DME [facility]  Please change oxygen order to continuous and portable 2L. Needs to evaluate best portable system for his needs.                               Note O2 sat at rest on room air here today 83%. Maintained sat 92% ambulating on 2 L continuous.                                                  Dx Chronic respiratory failure with hypoxia, COPD mixed type  Please call as needed

## 2016-07-19 NOTE — Telephone Encounter (Signed)
It is after hours will hold in Triage until monday

## 2016-07-19 NOTE — Assessment & Plan Note (Signed)
Severe COPD without acute exacerbation. Cold air likely aggravating. Discussed management including scar from across nose and mouth, staying indoors.

## 2016-07-19 NOTE — Assessment & Plan Note (Signed)
Oxygen saturation on room air at rest qualifies for continuous O2. Correction by oxygen 2 L with exertion demonstrated. Plan-change O2 order to continuous and portable at 2 L/[facility]

## 2016-07-22 ENCOUNTER — Telehealth: Payer: Self-pay | Admitting: Internal Medicine

## 2016-07-22 NOTE — Telephone Encounter (Signed)
Need to get the pt's okay, he does not have a DPR  ATC the number provided, NA and no option to leave msg, wcb

## 2016-07-22 NOTE — Telephone Encounter (Signed)
Called [facility] and spoke to [place]. Lissa [place] states they are needing a form signed by CY, this is for pt's continuous O2. Lissa HoardHelena is requesting that once form has been signed and faxed back to also fax the qualifying sats with it. Lissa HoardHelena states it has already been faxed but in case we havent received it she is faxing this again.   Will forward to Katie to complete and fax back.

## 2016-07-23 NOTE — Telephone Encounter (Signed)
Spoke with pt, states he found a med list and nothing further is needed.  Will close encounter.

## 2016-07-25 NOTE — Telephone Encounter (Signed)
I have form to be completed and will return to [place]. Thanks.

## 2016-07-25 NOTE — Telephone Encounter (Signed)
I have checked CY's look at. I did not see this form. Katie - before we request this form again, do you know if this has been completed?

## 2016-07-26 NOTE — Telephone Encounter (Signed)
Form has been faxed back. Nothing more needed at this time.

## 2016-11-15 ENCOUNTER — Ambulatory Visit (INDEPENDENT_AMBULATORY_CARE_PROVIDER_SITE_OTHER): Payer: Medicare HMO | Admitting: Adult Health

## 2016-11-15 ENCOUNTER — Ambulatory Visit (INDEPENDENT_AMBULATORY_CARE_PROVIDER_SITE_OTHER)
Admission: RE | Admit: 2016-11-15 | Discharge: 2016-11-15 | Disposition: A | Discharge status: Home / Self Care | Payer: Medicare HMO | Source: Ambulatory Visit | Attending: Adult Health | Admitting: Adult Health

## 2016-11-15 ENCOUNTER — Encounter: Payer: Self-pay | Admitting: Adult Health

## 2016-11-15 DIAGNOSIS — J189 Pneumonia, unspecified organism: Secondary | ICD-10-CM | POA: Diagnosis not present

## 2016-11-15 DIAGNOSIS — J449 Chronic obstructive pulmonary disease, unspecified: Secondary | ICD-10-CM

## 2016-11-15 DIAGNOSIS — J9611 Chronic respiratory failure with hypoxia: Secondary | ICD-10-CM | POA: Diagnosis not present

## 2016-11-15 MED ORDER — BUDESONIDE 0.25 MG/2ML IN SUSP: 0.2500 mg | Freq: Two times a day (BID) | RESPIRATORY_TRACT | 12 refills | Status: AC

## 2016-11-15 MED ORDER — BUDESONIDE 0.25 MG/2ML IN SUSP: 0.2500 mg | Freq: Two times a day (BID) | RESPIRATORY_TRACT | 12 refills | Status: DC

## 2016-11-15 NOTE — Progress Notes (Signed)
 @Patient  ID: Joseph Castaneda, male    DOB: 1941-06-13, 76 y.o.   MRN: [id number]  Chief Complaint  Patient presents with  . Follow-up    COPD     Referring provider: Janece CanterburyBoals, Aaron, MD  HPI: 76 year old male former smoker followed for COPD and asthma, O2 RF  ,  TEST  PFT 11/07/2014-severe obstructive airways disease with significant response to bronchodilator. FEV1 1.86/54% predicted, DLCO 51% Qualifying O2 saturation 07/19/2016-83% room air at rest, 92% on O2 2 L with ambulation   11/15/2016 Follow up : COPD /Post hospital follow up  Patient returns for follow-up. Patient says that he was [facility] month at [facility] for bilateral pneumonia. He was treated with antibiotics. Patient says he is starting to feel better. Says he is starting to feel better. Has finished all his abx. Has decreased cough and congestion . No fever or hemoptysis . Appetite is improved. No n/v.d.  Care everywhere showed a Xray done on 4/19 with BB opacities , trace B effusions.   Patient on Duoneb Four times a day  .   Oxygen was increased at discharge to 3l/m .    Allergies  Allergen Reactions  . Hydrocodone     REACTIONS: hallucinations    Immunization History  Administered Date(s) Administered  . Influenza Split 03/15/2013, 04/14/2014  . Influenza,inj,Quad PF,36+ Mos 06/05/2015, 04/01/2016  . Pneumococcal Conjugate-13 11/03/2015  . Pneumococcal Polysaccharide-23 04/27/2009    Past Medical History:  Diagnosis Date  . Asthma   . Emphysema of lung (HCC)   . Hyperlipidemia   . Hypertension     Tobacco History: History  Smoking Status  . Former Smoker  . Packs/day: 1.50  . Years: 35.00  . Types: Cigarettes  . Quit date: 07/15/1990  Smokeless Tobacco  . Never Used   Counseling given: Not Answered   Outpatient Encounter Prescriptions as of 11/15/2016  Medication Sig  . albuterol (PROAIR HFA) 108 (90 BASE) MCG/ACT inhaler 2 puffs every 4-6 hours as needed- rescue  .  albuterol (PROVENTIL) (2.5 MG/3ML) 0.083% nebulizer solution Take 3 mLs (2.5 mg total) by nebulization every 6 (six) hours as needed for wheezing or shortness of breath. DX 496  . amLODipine (NORVASC) 5 MG tablet Take 5 mg by mouth at bedtime.  [name]. aspirin 81 MG tablet Take 81 mg by mouth daily.  [name]. atorvastatin (LIPITOR) 40 MG tablet Take 1 tablet by mouth daily.  . Carbidopa-Levodopa ER (SINEMET CR) 25-100 MG tablet controlled release Take 1 tablet by mouth 3 (three) times daily.  . clopidogrel (PLAVIX) 75 MG tablet Take 75 mg by mouth daily.  [name]. etodolac (LODINE) 400 MG tablet Take 400 mg by mouth 2 (two) times daily.  . finasteride (PROSCAR) 5 MG tablet Take 1 tablet by mouth daily.  [name]. ipratropium-albuterol (DUONEB) 0.5-2.5 (3) MG/3ML SOLN Take 3 mLs by nebulization 4 (four) times daily.  . isosorbide dinitrate (ISORDIL) 30 MG tablet Take 30 mg by mouth daily.  . isosorbide mononitrate (IMDUR) 30 MG 24 hr tablet TAKE 1 TABLET EVERY DAY  . levofloxacin (LEVAQUIN) 750 MG tablet take 1 tablet by mouth once daily FOR 1 DAY, START 11/04/16  . loratadine (CLARITIN) 10 MG tablet Take 10 mg by mouth daily.  . metoprolol succinate (TOPROL-XL) 100 MG 24 hr tablet Take 100 mg by mouth daily.  . Nebulizers (COMPRESSOR/NEBULIZER) MISC Use as directed  . olmesartan (BENICAR) 40 MG tablet take 1/2 tablet by mouth once daily  . predniSONE (DELTASONE)  20 MG tablet Take 1 tablet (20 mg total) by mouth daily with breakfast.  . Psyllium (METAMUCIL PO) Take by mouth as needed.  . simvastatin (ZOCOR) 80 MG tablet Take 80 mg by mouth at bedtime.  . sulfaSALAzine (AZULFIDINE) 500 MG tablet Take 500 mg by mouth 2 (two) times daily.  . vitamin B-12 (CYANOCOBALAMIN) 100 MCG tablet Take 1 tablet by mouth daily.  . [DISCONTINUED] metoprolol succinate (TOPROL-XL) 100 MG 24 hr tablet Take by mouth.  . budesonide (PULMICORT) 0.25 MG/2ML nebulizer solution Take 2 mLs (0.25 mg total) by nebulization 2 (two) times daily.  .  nitroGLYCERIN (NITROSTAT) 0.4 MG SL tablet Place 0.4 mg under the tongue every 5 (five) minutes x 3 doses as needed.  . [DISCONTINUED] budesonide (PULMICORT) 0.25 MG/2ML nebulizer solution Take 2 mLs (0.25 mg total) by nebulization 2 (two) times daily.  . [DISCONTINUED] tiotropium (SPIRIVA) 18 MCG inhalation capsule Place 1 capsule (18 mcg total) into inhaler and inhale daily.   No facility-administered encounter medications on file as of 11/15/2016.      Review of Systems  Constitutional:   No  weight loss, night sweats,  Fevers, chills, + fatigue, or  lassitude.  HEENT:   No headaches,  Difficulty swallowing,  Tooth/dental problems, or  Sore throat,                No sneezing, itching, ear ache, nasal congestion, post nasal drip,   CV:  No chest pain,  Orthopnea, PND, swelling in lower extremities, anasarca, dizziness, palpitations, syncope.   GI  No heartburn, indigestion, abdominal pain, nausea, vomiting, diarrhea, change in bowel habits, loss of appetite, bloody stools.   Resp:  .  No chest wall deformity  Skin: no rash or lesions.  GU: no dysuria, change in color of urine, no urgency or frequency.  No flank pain, no hematuria   MS:  No joint pain or swelling.  No decreased range of motion.  No back pain.    Physical Exam  BP 122/80 (BP Location: Left Arm, Patient Position: Sitting, Cuff Size: Normal)   Pulse 62   Ht 6' (1.829 m)   Wt 238 lb (108 kg)   SpO2 96%   BMI 32.28 kg/m   GEN: A/Ox3; pleasant , NAD, elderly    HEENT:  /AT,  EACs-clear, TMs-wnl, NOSE-clear, THROAT-clear, no lesions, no postnasal drip or exudate noted.   NECK:  Supple w/ fair ROM; no JVD; normal carotid impulses w/o bruits; no thyromegaly or nodules palpated; no lymphadenopathy.    RESP  Decreased BS in bases ,  no accessory muscle use, no dullness to percussion  CARD:  RRR, no m/r/g, tr peripheral edema, pulses intact, no cyanosis or clubbing.  GI:   Soft & nt; nml bowel sounds; no  organomegaly or masses detected.   Musco: Warm bil, no deformities or joint swelling noted.   Neuro: alert, no focal deficits noted.    Skin: Warm, no lesions or rashes    Lab Results:  CBC No results found for: WBC, RBC, HGB, HCT, PLT, MCV, MCH, MCHC, RDW, LYMPHSABS, MONOABS, EOSABS, BASOSABS  BMET No results found for: NA, K, CL, CO2, GLUCOSE, BUN, CREATININE, CALCIUM, GFRNONAA, GFRAA  BNP No results found for: BNP  ProBNP No results found for: PROBNP  Imaging: No results found.   Assessment & Plan:   COPD mixed type Recent exacerbation now resolving  Add budesonide nebs via DME to current regimen with Duoneb  Plan  Patient Instructions  Add  Budeonside Neb Twice daily  . Will send to home care company to deliver.  Continue on Duoneb nebs Four times a day   Continue on oxygen 3l/m .  Chest xray today .  follow up Dr. Maple Hudson  In 2 months as planned and As needed   Please contact office for sooner follow up if symptoms do not improve or worsen or seek emergency care      Respiratory failure, chronic Cont on O2 at 3l/m  CAP (community acquired pneumonia) Bilateral PNA w/ recent admission to [facility]  Clinically improving  Check cxr today.,      Rubye Oaks, NP 11/15/2016

## 2016-11-15 NOTE — Assessment & Plan Note (Addendum)
Bilateral PNA w/ recent admission to [facility]  Clinically improving  Check cxr today.,

## 2016-11-15 NOTE — Assessment & Plan Note (Addendum)
Cont on O2 at 3l/m  

## 2016-11-15 NOTE — Patient Instructions (Addendum)
Add Budeonside Neb Twice daily  . Will send to home care company to deliver.  Continue on Duoneb nebs Four times a day   Continue on oxygen 3l/m .  Chest xray today .  follow up Dr. Maple HudsonYoung  In 2 months as planned and As needed   Please contact office for sooner follow up if symptoms do not improve or worsen or seek emergency care

## 2016-11-15 NOTE — Assessment & Plan Note (Addendum)
Recent exacerbation now resolving  Add budesonide nebs via DME to current regimen with Duoneb  Plan  Patient Instructions  Add Budeonside Neb Twice daily  . Will send to home care company to deliver.  Continue on Duoneb nebs Four times a day   Continue on oxygen 3l/m .  Chest xray today .  follow up Dr. Maple HudsonYoung  In 2 months as planned and As needed   Please contact office for sooner follow up if symptoms do not improve or worsen or seek emergency care

## 2016-11-19 NOTE — Progress Notes (Signed)
Spoke with patient and informed him of results and recommendations. Patient did not have any questions and verbalized understanding. Nothing further is needed.

## 2017-01-01 ENCOUNTER — Ambulatory Visit (INDEPENDENT_AMBULATORY_CARE_PROVIDER_SITE_OTHER): Payer: Medicare HMO | Admitting: Internal Medicine

## 2017-01-01 ENCOUNTER — Encounter: Payer: Self-pay | Admitting: Internal Medicine

## 2017-01-01 ENCOUNTER — Telehealth: Payer: Self-pay | Admitting: Pulmonary Disease

## 2017-01-01 VITALS — BP 110/70 | HR 73 | Ht 72.0 in | Wt 240.6 lb

## 2017-01-01 DIAGNOSIS — J9611 Chronic respiratory failure with hypoxia: Secondary | ICD-10-CM | POA: Diagnosis not present

## 2017-01-01 DIAGNOSIS — J441 Chronic obstructive pulmonary disease with (acute) exacerbation: Secondary | ICD-10-CM | POA: Diagnosis not present

## 2017-01-01 NOTE — Telephone Encounter (Signed)
Wife Liborio NixonJanice calling- Rx not called in I [facility] in [facility]s [facility]  prednisone 40mg  once daily x 3 days, then 30mg  once daily x 3 days, then 20mg  once daily x 3 days, then prednisone 10mg  once daily  x 3 days and stop   cephalexin 500mg  three times daily x  5 days

## 2017-01-01 NOTE — Progress Notes (Signed)
Subjective:     Patient ID: Joseph Castaneda, male   DOB: 02-24-1941, 76 y.o.   MRN: [id number]  HPI  Chief Complaint  Patient presents with  . Follow-up    COPD     Referring provider: Janece Canterbury, MD  HPI: 76 year old male former smoker followed for COPD and asthma, O2 RF  ,  TEST  PFT 11/07/2014-severe obstructive airways disease with significant response to bronchodilator. FEV1 1.86/54% predicted, DLCO 51% Qualifying O2 saturation 07/19/2016-83% room air at rest, 92% on O2 2 L with ambulation   [name] former smoker followed for asthma, COPD PFT 11/07/2014-severe obstructive airways disease with significant response to bronchodilator. FEV1 1.86/54% predicted, DLCO 51% Qualifying O2 saturation 07/19/2016-83% room air at rest, 92% on O2 2 L with ambulation ------------------------------------------------------------  11/03/2015-77 year old male former smoker followed for asthma, COPD, chronic respiratory failure, complicated by Parkinson's,  O2 2.5 L/[employer] sleep and exertion, CAD/CABG Last seen here by NP on 10/18/2015- following up ER visit for right lower lobe pneumonia, BNP 1956 with accelerated junctional rhythm, acute on chronic respiratory failure, acute exacerbation COPD treated with doxycycline, steroids, cefdinir.  Returning now for follow-up chest x-ray to confirm resolution of pneumonia. Follow for: SOB worse on exertion sx's about the same as last visit. Still using nebulizer as prn. Occ wheezing inhaler really helps with breathing. Denies any chest pain/tightness.Feeling weak and fatuigued same as last visit.  He feels back to baseline with at least some cough productive of clear sputum most days. No blood or chest pain. He doesn't think he has had Prevnar 13 pneumonia vaccine so we are giving it today. CXR 11/03/2015 (today)-reviewed, no pneumonia IMPRESSION: 1. Hyperinflation and bibasilar linear scarring or atelectasis  without acute or superimposed  abnormality. Electronically Signed  By: Corlis Leak M.D.  On: 11/03/2015 10:45  12/01/2016-76 year old male former smoker followed for asthma, COPD, chronic respiratory failure, complicated by Parkinson's, CAD/CABG O2 2.5 L/[employer]-sleep and exertion Pt is requesting a poc, as he does not currently have portable O2. pt states he has been unable to leave his house due to not having a poc. pt c/o prod cough with grey mucus, wheezing & chest tightness, but states this is his baseline. pt on 2.5L O2 Today on arrival on room air saturation 83%. He maintained at 92% ambulating on 2 L. wife indicates he is irregular about medication use. Nebulizer helps. Sputum is white. Daily productive cough with no blood or chest pain. Mentions needing nitroglycerin refill to be picked up. CXR 04-2016 IMPRESSION: COPD without acute abnorm   11/15/2016 Follow up : COPD /Post hospital follow up  Patient returns for follow-up. Patient says that he was admitted last month at [facility] for bilateral pneumonia. He was treated with antibiotics. Patient says he is starting to feel better. Says he is starting to feel better. Has finished all his abx. Has decreased cough and congestion . No fever or hemoptysis . Appetite is improved. No n/v.d.  Care everywhere showed a Xray done on 4/19 with BB opacities , trace B effusions.   Patient on Duoneb Four times a day  .   Oxygen was increased at discharge to 3l/m .   Acute OV 01/01/2017  Chief Complaint  Patient presents with  . Acute Visit    acute visit the patient is having trouble breathing for about a week now he is very weak      Acute visit. Here with his wife. Wife is disabled and on wheel chair due  to hip issues. He has advanced copd and on o2. He wheeled her in. He is an extremely poor historian. However they both attest that the shortness of breath is deteriorated that they're worried that he might need admission. According to the patient he has been  dyspneic only for 1 day. According to the wife is going dyspneic more than baseline for the last 1 week. There is associated increase cough with this. Both acknowledge there is no increased sputum production or change in sputum color. He is on DuoNeb 4 times daily but apparently been only taking it 1 or 2 times daily. He is not on maintenance inhaled steroid. There is no fever. He takes his oxygen daily. He is not on chronic steroids. There is no leg swelling or edema or hemoptysis or orthopnea. He is able to talk full sentences.    has a past medical history of Asthma; Emphysema of lung (HCC); Hyperlipidemia; and Hypertension.   reports that he quit smoking about 26 years ago. His smoking use included Cigarettes. He has a 52.50 pack-year smoking history. He has never used smokeless tobacco.  Past Surgical History:  Procedure Laterality Date  . CORONARY ARTERY BYPASS GRAFT    . FIXATION KYPHOPLASTY LUMBAR SPINE    . KYPHOSIS SURGERY     right shoulder  . SHOULDER ARTHROSCOPY     Right  . TONSILLECTOMY      Allergies  Allergen Reactions  . Hydrocodone     REACTIONS: hallucinations    Immunization History  Administered Date(s) Administered  . Influenza Split 03/15/2013, 04/14/2014  . Influenza,inj,Quad PF,36+ Mos 06/05/2015, 04/01/2016  . Pneumococcal Conjugate-13 11/03/2015  . Pneumococcal Polysaccharide-23 04/27/2009    Family History  Problem Relation Age of Onset  . Heart attack Father   . Breast cancer Mother   . Breast cancer Sister      Current Outpatient Prescriptions:  .  albuterol (PROAIR HFA) 108 (90 BASE) MCG/ACT inhaler, 2 puffs every 4-6 hours as needed- rescue, Disp: 1 Inhaler, Rfl: prn .  albuterol (PROVENTIL) (2.5 MG/3ML) 0.083% nebulizer solution, Take 3 mLs (2.5 mg total) by nebulization every 6 (six) hours as needed for wheezing or shortness of breath. DX 496, Disp: 360 mL, Rfl: 6 .  amLODipine (NORVASC) 5 MG tablet, Take 5 mg by mouth at bedtime., Disp: ,  Rfl:  .  aspirin 81 MG tablet, Take 81 mg by mouth daily., Disp: , Rfl:  .  atorvastatin (LIPITOR) 40 MG tablet, Take 1 tablet by mouth daily., Disp: , Rfl:  .  budesonide (PULMICORT) 0.25 MG/2ML nebulizer solution, Take 2 mLs (0.25 mg total) by nebulization 2 (two) times daily., Disp: 60 mL, Rfl: 12 .  Carbidopa-Levodopa ER (SINEMET CR) 25-100 MG tablet controlled release, Take 1 tablet by mouth 3 (three) times daily., Disp: , Rfl:  .  clopidogrel (PLAVIX) 75 MG tablet, Take 75 mg by mouth daily., Disp: , Rfl: 0 .  etodolac (LODINE) 400 MG tablet, Take 400 mg by mouth 2 (two) times daily., Disp: , Rfl:  .  finasteride (PROSCAR) 5 MG tablet, Take 1 tablet by mouth daily., Disp: , Rfl:  .  ipratropium-albuterol (DUONEB) 0.5-2.5 (3) MG/3ML SOLN, Take 3 mLs by nebulization 4 (four) times daily., Disp: , Rfl:  .  isosorbide dinitrate (ISORDIL) 30 MG tablet, Take 30 mg by mouth daily., Disp: , Rfl:  .  isosorbide mononitrate (IMDUR) 30 MG 24 hr tablet, TAKE 1 TABLET EVERY DAY, Disp: , Rfl:  .  levofloxacin (LEVAQUIN)  750 MG tablet, take 1 tablet by mouth once daily FOR 1 DAY, START 11/04/16, Disp: , Rfl: 0 .  loratadine (CLARITIN) 10 MG tablet, Take 10 mg by mouth daily., Disp: , Rfl:  .  metoprolol succinate (TOPROL-XL) 100 MG 24 hr tablet, Take 100 mg by mouth daily., Disp: , Rfl:  .  Nebulizers (COMPRESSOR/NEBULIZER) MISC, Use as directed, Disp: 1 each, Rfl: 0 .  olmesartan (BENICAR) 40 MG tablet, take 1/2 tablet by mouth once daily, Disp: 15 tablet, Rfl: 0 .  predniSONE (DELTASONE) 20 MG tablet, Take 1 tablet (20 mg total) by mouth daily with breakfast., Disp: 4 tablet, Rfl: 0 .  Psyllium (METAMUCIL PO), Take by mouth as needed., Disp: , Rfl:  .  simvastatin (ZOCOR) 80 MG tablet, Take 80 mg by mouth at bedtime., Disp: , Rfl:  .  sulfaSALAzine (AZULFIDINE) 500 MG tablet, Take 500 mg by mouth 2 (two) times daily., Disp: , Rfl:  .  vitamin B-12 (CYANOCOBALAMIN) 100 MCG tablet, Take 1 tablet by mouth  daily., Disp: , Rfl:  .  nitroGLYCERIN (NITROSTAT) 0.4 MG SL tablet, Place 0.4 mg under the tongue every 5 (five) minutes x 3 doses as needed., Disp: , Rfl:     Review of Systems     Objective:   Physical Exam  Constitutional: He is oriented to person, place, and time. He appears well-developed and well-nourished. No distress.  HENT:  Head: Normocephalic and atraumatic.  Right Ear: External ear normal.  Left Ear: External ear normal.  Mouth/Throat: Oropharynx is clear and moist. No oropharyngeal exudate.  o2 on disheveled  Eyes: Conjunctivae and EOM are normal. Pupils are equal, round, and reactive to light. Right eye exhibits no discharge. Left eye exhibits no discharge. No scleral icterus.  Neck: Normal range of motion. Neck supple. No JVD present. No tracheal deviation present. No thyromegaly present.  Cardiovascular: Normal rate, regular rhythm and intact distal pulses.  Exam reveals no gallop and no friction rub.   No murmur heard. Pulmonary/Chest: Effort normal and breath sounds normal. No respiratory distress. He has no wheezes. He has no rales. He exhibits no tenderness.  barrell chest Full stencnes No acc muscule use No wheeze  Abdominal: Soft. Bowel sounds are normal. He exhibits no distension and no mass. There is no tenderness. There is no rebound and no guarding.  Musculoskeletal: Normal range of motion. He exhibits no edema or tenderness.  Lymphadenopathy:    He has no cervical adenopathy.  Neurological: He is alert and oriented to person, place, and time. He has normal reflexes. No cranial nerve deficit. Coordination normal.  Skin: Skin is warm and dry. No rash noted. He is not diaphoretic. No erythema. No pallor.  Psychiatric: He has a normal mood and affect. His behavior is normal. Judgment and thought content normal.  Nursing note and vitals reviewed.  Vitals:   01/01/17 1254 01/01/17 1255  BP:  110/70  Pulse:  73  SpO2:  95%  Weight: 240 lb 9.6 oz (109.1  kg)   Height: 6' (1.829 m)    Estimated body mass index is 32.63 kg/m as calculated from the following:   Height as of this encounter: 6' (1.829 m).   Weight as of this encounter: 240 lb 9.6 oz (109.1 kg).      Assessment:       ICD-10-CM   1. COPD with acute exacerbation (HCC) J44.1   2. Chronic respiratory failure with hypoxia (HCC) J96.11  Plan:      Copd flare up ? Virus v  ? not taking nebs properly v  ? Summer v ? Lack of daily steroid inhaler- most likely diagnosis  Plan Take duoneb 4 times daily scheduled  Please take Take prednisone 40mg  once daily x 3 days, then 30mg  once daily x 3 days, then 20mg  once daily x 3 days, then prednisone 10mg  once daily  x 3 days and stop (assuming you are not on chronic daily prednisone)  STart cephalexin 500mg  three times daily x  5 days  Start arnuity inhaler daily once daily  Continue o2 daily  If unimproved or getting worse, call EMS and go to ER  Followup 2 months with Dr Maple Hudson   > 50% of this > 25 min visit spent in face to face counseling or coordination of care    Dr. Kalman Shan, M.D., [facility].C.P Pulmonary and Critical Care Medicine Staff Physician [facility] Pulmonary and Critical Care Pager: [phone], If no answer or between  15:00h - 7:00h: call [phone]  01/01/2017 1:17 PM

## 2017-01-01 NOTE — Patient Instructions (Addendum)
ICD-10-CM   1. COPD with acute exacerbation (HCC) J44.1   2. Chronic respiratory failure with hypoxia (HCC) J96.11     Copd flare up ? Virus v  ? not taking nebs properly v  ? Summer v ? Lack of daily steroid inhaler- most likely diagnosis  Plan Take duoneb 4 times daily scheduled  Please take Take prednisone 40mg  once daily x 3 days, then 30mg  once daily x 3 days, then 20mg  once daily x 3 days, then prednisone 10mg  once daily  x 3 days and stop (assuming you are not on chronic daily prednisone)  STart cephalexin 500mg  three times daily x  5 days  Start arnuity inhaler daily once daily  Continue o2 daily  If unimproved or getting worse, call EMS and go to ER  Followup 2 months with Dr Maple HudsonYoung

## 2017-01-02 NOTE — Telephone Encounter (Signed)
Joseph Castaneda  Could you please find out why this was not called in yesterday?  Thanks  Dr. Kalman ShanMurali Azarius Lambson, M.D., [facility].C.P Pulmonary and Critical Care Medicine Staff Physician [facility] Pulmonary and Critical Care Pager: [phone], If no answer or between  15:00h - 7:00h: call [phone]  01/02/2017 8:07 AM

## 2017-01-03 NOTE — Telephone Encounter (Signed)
ATC pt, no VM. WCB.

## 2017-01-06 NOTE — Telephone Encounter (Signed)
Called and spoke to pt's wife. Pt's wife gave identification number at top of AVS showing how discharged the pt. This will be addressed with that individual.   Will speak with other TL to address issue. Nothing further needed. Will sign off.

## 2017-01-10 MED ORDER — PREDNISONE 10 MG PO TABS: ORAL_TABLET | ORAL | 0 refills | Status: DC

## 2017-01-10 MED ORDER — FLUTICASONE FUROATE 100 MCG/ACT IN AEPB: 1.0000 | INHALATION_SPRAY | Freq: Every day | RESPIRATORY_TRACT | 11 refills | Status: AC

## 2017-01-10 MED ORDER — CEPHALEXIN 500 MG PO CAPS: 500.0000 mg | ORAL_CAPSULE | Freq: Three times a day (TID) | ORAL | 0 refills | Status: DC

## 2017-01-16 ENCOUNTER — Encounter: Payer: Self-pay | Admitting: Internal Medicine

## 2017-01-16 ENCOUNTER — Ambulatory Visit (INDEPENDENT_AMBULATORY_CARE_PROVIDER_SITE_OTHER): Payer: Medicare HMO | Admitting: Internal Medicine

## 2017-01-16 ENCOUNTER — Telehealth: Payer: Self-pay | Admitting: Internal Medicine

## 2017-01-16 DIAGNOSIS — J441 Chronic obstructive pulmonary disease with (acute) exacerbation: Secondary | ICD-10-CM

## 2017-01-16 DIAGNOSIS — J9611 Chronic respiratory failure with hypoxia: Secondary | ICD-10-CM

## 2017-01-16 DIAGNOSIS — J189 Pneumonia, unspecified organism: Secondary | ICD-10-CM

## 2017-01-16 DIAGNOSIS — J181 Lobar pneumonia, unspecified organism: Secondary | ICD-10-CM

## 2017-01-16 NOTE — Progress Notes (Signed)
HPI Joseph Castaneda former smoker followed for asthma, COPD, chronic respiratory failure, complicated by Parkinson's, CAD/CABG PFT 11/07/2014-severe obstructive airways disease with significant response to bronchodilator. FEV1 1.86/54% predicted, DLCO 51% Qualifying O2 saturation 07/19/2016-83% room air at rest, 92% on O2 2 L with ambulation -----------------------------------------------------------  12/01/2016-76 year old male former smoker followed for asthma, COPD, chronic respiratory failure, complicated by Parkinson's, CAD/CABG O2 2.5 L/[employer]-sleep and exertion Pt is requesting a poc, as he does not currently have portable O2. pt states he has been unable to leave his house due to not having a poc. pt c/o prod cough with grey mucus, wheezing & chest tightness, but states this is his baseline. pt on 2.5L O2 Today on arrival on room air saturation 83%. He maintained at 92% ambulating on 2 L. wife indicates he is irregular about medication use. Nebulizer helps. Sputum is white. Daily productive cough with no blood or chest pain. Mentions needing nitroglycerin refill to be picked up. CXR 04-2016 IMPRESSION: COPD without acute abnormality.  01/16/17-76 year old male former smoker followed for asthma, COPD, chronic respiratory failure, complicated by Parkinson's, CAD/CABG O2 2.5 L/[employer]-sleep and exertion Pt here for 49mo follow up for COPD. Pt vistited [facility] 2 weeks ago for 1wk due to CHF, fluid on the lungs, Edema. Pt has neb treatments 4 times daily in last two weeks since hospital stay.  [facility] 01/04/17-acute CHF, uncontrolled HBP, CM Daughter helps with history. After hospital stay he has been at rehabilitation where he was recently told he had pneumonia and put on Levaquin 5 days. He thinks he had CXR. Oxygen had been turned up to 4 L. Today he says he is doing pretty well. Getting a nebulizer treatment at least once daily. Dropped off Spiriva since his neb solution is  Duoneb. CXR 11/15/16- IMPRESSION: 1. COPD/emphysema. Linear scarring in the lingula. No acute cardiopulmonary disease otherwise. Stable scarring in the lower lobes and in the apices. 2. Stable mild cardiomegaly without pulmonary edema  ROS-see HPI   Negative unless "+" Constitutional:    weight loss, night sweats, fevers, chills, fatigue, lassitude. HEENT:    headaches, difficulty swallowing, tooth/dental problems, sore throat,       sneezing, itching, ear ache, nasal congestion, post nasal drip, snoring CV:    chest pain, orthopnea, PND, swelling in lower extremities, anasarca,                                                      dizziness, palpitations Resp:   + shortness of breath with exertion or at rest.                +productive cough,   non-productive cough, coughing up of blood.              change in color of mucus.  wheezing.   Skin:    rash or lesions.  GI:  No-   heartburn, indigestion, abdominal pain, nausea,  GU:  MS:   joint pain, stiffness,  Neuro-     nothing unusual Psych:  change in mood or affect.  depression or anxiety.   memory loss.  OBJ- Physical Exam General- Alert, Oriented, Affect-appropriate, Distress- none acute, + obese,  O2 concentrator pulse Skin- rash-none, lesions- none, excoriation- none Lymphadenopathy- none Head- atraumatic            Eyes- Gross  vision intact, PERRLA, conjunctivae and secretions clear            Ears- Hearing, canals-normal            Nose- Clear, no-Septal dev, mucus, polyps, erosion, perforation             Throat- Mallampati II , mucosa clear , drainage- none, tonsils- atrophic Neck- flexible , trachea midline, no stridor , thyroid nl, carotid no bruit Chest - symmetrical excursion , unlabored           Heart/CV- RRR , no murmur , no gallop  , no rub, nl s1 s2                           - JVD- none , edema- none, stasis changes- none, varices- none           Lung- + few rhonchi right base, wheeze- none, cough + loose rattle  , dullness-none, rub- none           Chest wall-  Abd-  Br/ Gen/ Rectal- Not done, not indicated Extrem- cyanosis- none, clubbing, none, atrophy- none, strength- nl Neuro- + pill- rolling tremor R hand

## 2017-01-16 NOTE — Patient Instructions (Signed)
Ok to continue present meds  Ok to continue O2 at 3L - 4L   Please call if we can help

## 2017-01-16 NOTE — Telephone Encounter (Signed)
Called and spoke with Joseph Castaneda and she wanted to let CY know that the pt has PNA again---she is unsure if treatment has been started but will find out from the nurse when she picks him up today---she will bring this info today to the appt.

## 2017-01-16 NOTE — Telephone Encounter (Signed)
Spoke with Aram Beechamynthia. Pt was seen today by CY. She had a copy of rehab report that she needed to turn in but she lost it. [place] [facility] [place] [facility] [place] [facility]. Will fax our copy to facility. Nothing else needed at time of call.

## 2017-01-19 NOTE — Assessment & Plan Note (Addendum)
We discussed his oxygen use especially with pulse concentrator. He will continue at 3-4 liters

## 2017-01-19 NOTE — Assessment & Plan Note (Signed)
Recently completed Levaquin. We will have him report symptoms of relapse that would require an extension of therapy as discussed.

## 2017-01-19 NOTE — Assessment & Plan Note (Signed)
Recent pneumonia with exacerbation, now finished antibiotic and close to baseline with residual rhonchi right base. We agreed he was being followed closely enough that there would be no benefit in chest x-ray this soon. That should be done if symptoms relapse.

## 2017-06-20 ENCOUNTER — Telehealth: Payer: Self-pay | Admitting: Internal Medicine

## 2017-06-20 ENCOUNTER — Ambulatory Visit: Payer: Medicare HMO | Admitting: Pulmonary Disease

## 2017-06-20 NOTE — Telephone Encounter (Signed)
Have tried to call and speak with pt's wife, Liborio NixonJanice but each time when I call the number we have on file, all I get is a busy signal. Tried to call pt's mobile number and I get a recording that this number is not a working number.  Will try to see if I can get ahold of the pt or his wife later so we can see if we can get pt scheduled for an appt.

## 2017-06-20 NOTE — Telephone Encounter (Signed)
Spoke with pt's wife, Liborio NixonJanice who states that pt's SOB has become worse and his cough has become worse as well.  Told Liborio NixonJanice that pt should probably be seen by one of our providers today due to symptoms worsening.  Liborio NixonJanice said that she would try to see if they could get a ride so they could come to the office for pt to be seen.  Went ahead and made an appt with Dr. Craige CottaSood today at 4:15 and told Liborio NixonJanice that time for an appt today.  Liborio NixonJanice said she would call back if they are unable to make that appt for today. If unable to come in, will cancel that appt.

## 2017-07-21 ENCOUNTER — Ambulatory Visit: Payer: Medicare HMO | Admitting: Internal Medicine

## 2017-08-07 ENCOUNTER — Ambulatory Visit (INDEPENDENT_AMBULATORY_CARE_PROVIDER_SITE_OTHER): Payer: Medicare HMO | Admitting: Internal Medicine

## 2017-08-07 ENCOUNTER — Ambulatory Visit (INDEPENDENT_AMBULATORY_CARE_PROVIDER_SITE_OTHER)
Admission: RE | Admit: 2017-08-07 | Discharge: 2017-08-07 | Disposition: A | Discharge status: Home / Self Care | Payer: Medicare HMO | Source: Ambulatory Visit | Attending: Internal Medicine | Admitting: Internal Medicine

## 2017-08-07 ENCOUNTER — Encounter: Payer: Self-pay | Admitting: Internal Medicine

## 2017-08-07 VITALS — BP 118/70 | HR 59 | Ht 72.0 in | Wt 231.0 lb

## 2017-08-07 DIAGNOSIS — Z23 Encounter for immunization: Secondary | ICD-10-CM | POA: Diagnosis not present

## 2017-08-07 DIAGNOSIS — J9611 Chronic respiratory failure with hypoxia: Secondary | ICD-10-CM

## 2017-08-07 DIAGNOSIS — J449 Chronic obstructive pulmonary disease, unspecified: Secondary | ICD-10-CM | POA: Diagnosis not present

## 2017-08-07 NOTE — Progress Notes (Signed)
HPI 60 [name] former smoker followed for asthma, COPD, chronic respiratory failure, complicated by Parkinson's, CAD/CABG PFT 11/07/2014-severe obstructive airways disease with significant response to bronchodilator. FEV1 1.86/54% predicted, DLCO 51% Qualifying O2 saturation 07/19/2016-83% room air at rest, 92% on O2 2 L with ambulation -----------------------------------------------------------  01/16/17-77 year old male former smoker followed for asthma, COPD, chronic respiratory failure, complicated by Parkinson's, CAD/CABG O2 2.5 L/[employer]-sleep and exertion Pt here for 4mo follow up for COPD. Pt vistited [facility] 2 weeks ago for 1wk due to CHF, fluid on the lungs, Edema. Pt has neb treatments 4 times daily in last two weeks since hospital stay.  [facility] 01/04/17-acute CHF, uncontrolled HBP, CM Daughter helps with history. After hospital stay he has been at rehabilitation where he was recently told he had pneumonia and put on Levaquin 5 days. He thinks he had CXR. Oxygen had been turned up to 4 L. Today he says he is doing pretty well. Getting a nebulizer treatment at least once daily. Dropped off Spiriva since his neb solution is Duoneb. CXR 11/15/16- IMPRESSION: 1. COPD/emphysema. Linear scarring in the lingula. No acute cardiopulmonary disease otherwise. Stable scarring in the lower lobes and in the apices. 2. Stable mild cardiomegaly without pulmonary edema  08/07/17- 77 year old male former smoker followed for asthma, COPD, chronic respiratory failure, complicated by Parkinson's, CAD/CABG O2 2.5 L/[employer]-sleep and exertion ----for COPD. States no increased SOB but has had some increased coughing. No productive cough.  Nebulizer DuoNeb, budesonide, Pro-air HFA, Arnuity,  Now living in a care facility.  Depends on oxygen.  Daily cough-clear mucus.  Denies chest pain or acute respiratory events.  ROS-see HPI   + = positive Constitutional:    weight loss, night sweats, fevers,  chills, fatigue, lassitude. HEENT:    headaches, difficulty swallowing, tooth/dental problems, sore throat,       sneezing, itching, ear ache, nasal congestion, post nasal drip, snoring CV:    chest pain, orthopnea, PND, swelling in lower extremities, anasarca,                                                      dizziness, palpitations Resp:   + shortness of breath with exertion or at rest.                +productive cough,   non-productive cough, coughing up of blood.              change in color of mucus.  wheezing.   Skin:    rash or lesions.  GI:  No-   heartburn, indigestion, abdominal pain, nausea,  GU:  MS:   joint pain, stiffness,  Neuro-     nothing unusual Psych:  change in mood or affect.  depression or anxiety.   memory loss.  OBJ- Physical Exam General- Alert, Oriented, Affect-appropriate, Distress- none acute, + obese,  O2 concentrator pulse Skin- rash-none, lesions- none, excoriation- none Lymphadenopathy- none Head- atraumatic            Eyes- Gross vision intact, PERRLA, conjunctivae and secretions clear            Ears- Hearing, canals-normal            Nose- Clear, no-Septal dev, mucus, polyps, erosion, perforation             Throat- Mallampati II , mucosa  clear , drainage- none, tonsils- atrophic Neck- flexible , trachea midline, no stridor , thyroid nl, carotid no bruit Chest - symmetrical excursion , unlabored           Heart/CV- RRR , no murmur , no gallop  , no rub, nl s1 s2                           - JVD- none , edema+ 1 feet, varices- none           Lung- + crackles left base, wheeze- none, cough +, dullness-none, rub- none unlabored           Chest wall-  Abd-  Br/ Gen/ Rectal- Not done, not indicated Extrem- cyanosis- none, clubbing, none, atrophy- none, strength- nl Neuro- + pill- rolling tremor R hand

## 2017-08-07 NOTE — Patient Instructions (Signed)
Order- flu vax- senior  Order- CXR   Dx COPD mixed type  We can continue Oxygen at 2.5- 3 liters, and current meds.  Please call if we can help

## 2017-08-08 DIAGNOSIS — J449 Chronic obstructive pulmonary disease, unspecified: Secondary | ICD-10-CM | POA: Insufficient documentation

## 2017-08-08 NOTE — Assessment & Plan Note (Signed)
Severe COPD, clinically stable. Plan-continue current meds.  Walking with appropriate support, for endurance as able.

## 2017-08-08 NOTE — Assessment & Plan Note (Signed)
He remains dependent but stable on oxygen.

## 2017-08-20 ENCOUNTER — Telehealth: Payer: Self-pay | Admitting: Internal Medicine

## 2017-08-20 NOTE — Telephone Encounter (Signed)
Spoke with patients wife, they are aware of results and verbalized understanding.

## 2018-02-04 ENCOUNTER — Encounter: Payer: Self-pay | Admitting: Internal Medicine

## 2018-02-04 ENCOUNTER — Ambulatory Visit (INDEPENDENT_AMBULATORY_CARE_PROVIDER_SITE_OTHER): Payer: Medicaid Other | Admitting: Internal Medicine

## 2018-02-04 DIAGNOSIS — J9611 Chronic respiratory failure with hypoxia: Secondary | ICD-10-CM | POA: Diagnosis not present

## 2018-02-04 DIAGNOSIS — J449 Chronic obstructive pulmonary disease, unspecified: Secondary | ICD-10-CM

## 2018-02-04 NOTE — Assessment & Plan Note (Signed)
Continues to benefit from O2 during sleep to maintain sats.

## 2018-02-04 NOTE — Patient Instructions (Addendum)
I recommend your Nursing Home get a chest xray to keep track because of your significant chronic bronchitis.  I recommend you get a nebulizer treatment with DuoNeb 3 times daily, and 4 times daily if you are having worse breathing problems.  Ok to continue your current breathing meds and the oxygen at 2 L for sleep   Please call if we can help

## 2018-02-04 NOTE — Assessment & Plan Note (Signed)
Significant chest congestion. CXR requires prior authorization according to papers brought with him, so that is deferred to his Nursing Home. I don't know that he could use a Flutter device effectively.  Plan- recommend he use nebulizer 2-3 times daily as discussed.

## 2018-02-04 NOTE — Progress Notes (Signed)
HPI 53 [name] former smoker followed for asthma, COPD, chronic respiratory failure, complicated by Parkinson's, CAD/CABG PFT 11/07/2014-severe obstructive airways disease with significant response to bronchodilator. FEV1 1.86/54% predicted, DLCO 51% Qualifying O2 saturation 07/19/2016-83% room air at rest, 92% on O2 2 L with ambulation -----------------------------------------------------------  08/07/17- 77 year old male former smoker followed for asthma, COPD, chronic respiratory failure, complicated by Parkinson's, CAD/CABG O2 2.5 L/[employer]-sleep and exertion ----for COPD. States no increased SOB but has had some increased coughing. No productive cough.  Nebulizer DuoNeb, budesonide, Pro-air HFA, Arnuity,  Now living in a care facility.  Depends on oxygen.  Daily cough-clear mucus.  Denies chest pain or acute respiratory events.  02/04/2018- 77 year old male former smoker followed for asthma, COPD, chronic respiratory failure, complicated by Parkinson's, CAD/CABG, HBP, A.flutter, CHF O2 2.5 L/[employer]-sleep and exertion  [facility] ER visit  5/31- fell out of bed at nursing home- laceration head. -----Breathing is doing okay he is having some wheezing and productive cough but he has not looked at the color.  He is brought in wheelchair by attendant. DuoNeb/ Pulmicort, pro-air HFA, Arnuity,  Says he feels well but admits always has deep chest cough, minimally productive without change. Wears is O2 for sleep. CXR 08/07/2017 COPD without evidence of acute abnormality.  ROS-see HPI   + = positive Constitutional:    weight loss, night sweats, fevers, chills, fatigue, lassitude. HEENT:    headaches, difficulty swallowing, tooth/dental problems, sore throat,       sneezing, itching, ear ache, nasal congestion, post nasal drip, snoring CV:    chest pain, orthopnea, PND, swelling in lower extremities, anasarca,                                                      dizziness, palpitations Resp:   + shortness of  breath with exertion or at rest.                +productive cough,   non-productive cough, coughing up of blood.              change in color of mucus.  wheezing.   Skin:    rash or lesions.  GI:  No-   heartburn, indigestion, abdominal pain, nausea,  GU:  MS:   joint pain, stiffness,  Neuro-     nothing unusual Psych:  change in mood or affect.  depression or anxiety.   memory loss.  OBJ- Physical Exam General- Alert, Oriented, Affect-appropriate, Distress- none acute, + obese,  O2 concentrator pulse Skin- rash-none, lesions- none, excoriation- none Lymphadenopathy- none Head- atraumatic            Eyes- Gross vision intact, PERRLA, conjunctivae and secretions clear            Ears- Hearing, canals-normal            Nose- Clear, no-Septal dev, mucus, polyps, erosion, perforation             Throat- Mallampati II , mucosa clear , drainage- none, tonsils- atrophic Neck- flexible , trachea midline, no stridor , thyroid nl, carotid no bruit Chest - symmetrical excursion , unlabored           Heart/CV- RRR at this visit , no murmur , no gallop  , no rub, nl s1 s2                           -  JVD- none , edema-none, varices- none           Lung- + crackles left base, wheeze- none, cough +deeply congested, dullness-none, rub- none unlabored           Chest wall-  Abd-  Br/ Gen/ Rectal- Not done, not indicated Extrem- cyanosis- none, clubbing, none, atrophy- none, strength- nl Neuro- + pill- rolling tremor R hand

## 2018-08-07 ENCOUNTER — Ambulatory Visit: Payer: Medicaid Other | Admitting: Internal Medicine

## 2021-01-12 DEATH — deceased
